# Patient Record
Sex: Female | Born: 1935
Health system: Southern US, Community
[De-identification: ages and names within clinical notes are randomized; demographics above are authoritative.]

## PROBLEM LIST (undated history)

## (undated) DIAGNOSIS — D499 Neoplasm of unspecified behavior of unspecified site: Secondary | ICD-10-CM

## (undated) DIAGNOSIS — A0472 Enterocolitis due to Clostridium difficile, not specified as recurrent: Secondary | ICD-10-CM

## (undated) DIAGNOSIS — I10 Essential (primary) hypertension: Secondary | ICD-10-CM

## (undated) DIAGNOSIS — E785 Hyperlipidemia, unspecified: Secondary | ICD-10-CM

## (undated) HISTORY — DX: Neoplasm of unspecified behavior of unspecified site: D49.9

## (undated) HISTORY — PX: SKIN SURGERY: SHX2413

## (undated) HISTORY — DX: Hyperlipidemia, unspecified: E78.5

## (undated) HISTORY — DX: Enterocolitis due to Clostridium difficile, not specified as recurrent: A04.72

## (undated) HISTORY — DX: Essential (primary) hypertension: I10

---

## 1980-08-29 HISTORY — PX: BREAST BIOPSY: SHX20

## 1982-08-29 HISTORY — PX: ABDOMINAL HYSTERECTOMY: SUR658

## 2014-09-19 DIAGNOSIS — L821 Other seborrheic keratosis: Secondary | ICD-10-CM | POA: Diagnosis not present

## 2014-09-19 DIAGNOSIS — Z85828 Personal history of other malignant neoplasm of skin: Secondary | ICD-10-CM | POA: Diagnosis not present

## 2015-01-14 DIAGNOSIS — L814 Other melanin hyperpigmentation: Secondary | ICD-10-CM | POA: Diagnosis not present

## 2015-01-14 DIAGNOSIS — D485 Neoplasm of uncertain behavior of skin: Secondary | ICD-10-CM | POA: Diagnosis not present

## 2015-01-14 DIAGNOSIS — Z85828 Personal history of other malignant neoplasm of skin: Secondary | ICD-10-CM | POA: Diagnosis not present

## 2015-01-25 DIAGNOSIS — I10 Essential (primary) hypertension: Secondary | ICD-10-CM | POA: Diagnosis not present

## 2015-01-25 DIAGNOSIS — E785 Hyperlipidemia, unspecified: Secondary | ICD-10-CM | POA: Diagnosis not present

## 2015-04-06 DIAGNOSIS — Z85828 Personal history of other malignant neoplasm of skin: Secondary | ICD-10-CM | POA: Diagnosis not present

## 2015-04-06 DIAGNOSIS — L570 Actinic keratosis: Secondary | ICD-10-CM | POA: Diagnosis not present

## 2015-04-06 DIAGNOSIS — D485 Neoplasm of uncertain behavior of skin: Secondary | ICD-10-CM | POA: Diagnosis not present

## 2015-07-26 DIAGNOSIS — Z760 Encounter for issue of repeat prescription: Secondary | ICD-10-CM | POA: Diagnosis not present

## 2015-07-26 DIAGNOSIS — E785 Hyperlipidemia, unspecified: Secondary | ICD-10-CM | POA: Diagnosis not present

## 2015-07-26 DIAGNOSIS — I10 Essential (primary) hypertension: Secondary | ICD-10-CM | POA: Diagnosis not present

## 2015-08-30 DIAGNOSIS — A0472 Enterocolitis due to Clostridium difficile, not specified as recurrent: Secondary | ICD-10-CM

## 2015-08-30 HISTORY — DX: Enterocolitis due to Clostridium difficile, not specified as recurrent: A04.72

## 2015-09-22 DIAGNOSIS — L570 Actinic keratosis: Secondary | ICD-10-CM | POA: Diagnosis not present

## 2015-09-22 DIAGNOSIS — L0109 Other impetigo: Secondary | ICD-10-CM | POA: Diagnosis not present

## 2015-09-22 DIAGNOSIS — L821 Other seborrheic keratosis: Secondary | ICD-10-CM | POA: Diagnosis not present

## 2015-09-22 DIAGNOSIS — L814 Other melanin hyperpigmentation: Secondary | ICD-10-CM | POA: Diagnosis not present

## 2015-09-22 DIAGNOSIS — Z85828 Personal history of other malignant neoplasm of skin: Secondary | ICD-10-CM | POA: Diagnosis not present

## 2015-09-22 DIAGNOSIS — L579 Skin changes due to chronic exposure to nonionizing radiation, unspecified: Secondary | ICD-10-CM | POA: Diagnosis not present

## 2015-10-09 DIAGNOSIS — Z85828 Personal history of other malignant neoplasm of skin: Secondary | ICD-10-CM | POA: Diagnosis not present

## 2015-10-09 DIAGNOSIS — L308 Other specified dermatitis: Secondary | ICD-10-CM | POA: Diagnosis not present

## 2015-11-04 DIAGNOSIS — L0109 Other impetigo: Secondary | ICD-10-CM | POA: Diagnosis not present

## 2015-11-04 DIAGNOSIS — L0100 Impetigo, unspecified: Secondary | ICD-10-CM | POA: Diagnosis not present

## 2015-12-16 DIAGNOSIS — L2089 Other atopic dermatitis: Secondary | ICD-10-CM | POA: Diagnosis not present

## 2016-01-17 DIAGNOSIS — E119 Type 2 diabetes mellitus without complications: Secondary | ICD-10-CM | POA: Diagnosis not present

## 2016-01-17 DIAGNOSIS — E7800 Pure hypercholesterolemia, unspecified: Secondary | ICD-10-CM | POA: Diagnosis not present

## 2016-01-17 DIAGNOSIS — I10 Essential (primary) hypertension: Secondary | ICD-10-CM | POA: Diagnosis not present

## 2016-01-18 LAB — HEMOGLOBIN A1C: Hemoglobin A1C: 5.5

## 2016-01-18 LAB — BASIC METABOLIC PANEL
BUN: 20 mg/dL (ref 4–21)
CREATININE: 1.1 mg/dL (ref 0.5–1.1)
GLUCOSE: 98 mg/dL
POTASSIUM: 4.3 mmol/L (ref 3.4–5.3)
SODIUM: 140 mmol/L (ref 137–147)

## 2016-01-18 LAB — LIPID PANEL
Cholesterol: 176 mg/dL (ref 0–200)
HDL: 77 mg/dL — AB (ref 35–70)
LDL Cholesterol: 76 mg/dL
Triglycerides: 117 mg/dL (ref 40–160)

## 2016-01-18 LAB — HEPATIC FUNCTION PANEL
ALT: 12 U/L (ref 7–35)
AST: 17 U/L (ref 13–35)
Alkaline Phosphatase: 85 U/L (ref 25–125)
BILIRUBIN, TOTAL: 0.5 mg/dL

## 2016-01-18 LAB — TSH: TSH: 0.86 u[IU]/mL (ref 0.41–5.90)

## 2016-01-23 DIAGNOSIS — E785 Hyperlipidemia, unspecified: Secondary | ICD-10-CM | POA: Diagnosis not present

## 2016-01-23 DIAGNOSIS — I10 Essential (primary) hypertension: Secondary | ICD-10-CM | POA: Diagnosis not present

## 2016-04-04 DIAGNOSIS — L821 Other seborrheic keratosis: Secondary | ICD-10-CM | POA: Diagnosis not present

## 2016-04-04 DIAGNOSIS — Z85828 Personal history of other malignant neoplasm of skin: Secondary | ICD-10-CM | POA: Diagnosis not present

## 2016-04-04 DIAGNOSIS — L570 Actinic keratosis: Secondary | ICD-10-CM | POA: Diagnosis not present

## 2016-05-02 DIAGNOSIS — R195 Other fecal abnormalities: Secondary | ICD-10-CM | POA: Diagnosis not present

## 2016-05-02 DIAGNOSIS — D72829 Elevated white blood cell count, unspecified: Secondary | ICD-10-CM | POA: Diagnosis not present

## 2016-05-02 DIAGNOSIS — R109 Unspecified abdominal pain: Secondary | ICD-10-CM | POA: Diagnosis not present

## 2016-05-03 DIAGNOSIS — R195 Other fecal abnormalities: Secondary | ICD-10-CM | POA: Diagnosis not present

## 2016-05-06 DIAGNOSIS — R109 Unspecified abdominal pain: Secondary | ICD-10-CM | POA: Diagnosis not present

## 2016-05-16 DIAGNOSIS — Z1211 Encounter for screening for malignant neoplasm of colon: Secondary | ICD-10-CM | POA: Diagnosis not present

## 2016-05-16 DIAGNOSIS — R109 Unspecified abdominal pain: Secondary | ICD-10-CM | POA: Diagnosis not present

## 2016-06-07 DIAGNOSIS — R634 Abnormal weight loss: Secondary | ICD-10-CM | POA: Diagnosis not present

## 2016-06-07 DIAGNOSIS — K625 Hemorrhage of anus and rectum: Secondary | ICD-10-CM | POA: Diagnosis not present

## 2016-06-07 DIAGNOSIS — R630 Anorexia: Secondary | ICD-10-CM | POA: Diagnosis not present

## 2016-06-07 DIAGNOSIS — R194 Change in bowel habit: Secondary | ICD-10-CM | POA: Diagnosis not present

## 2016-06-09 DIAGNOSIS — R197 Diarrhea, unspecified: Secondary | ICD-10-CM | POA: Diagnosis not present

## 2016-06-10 DIAGNOSIS — Z87891 Personal history of nicotine dependence: Secondary | ICD-10-CM | POA: Diagnosis not present

## 2016-06-10 DIAGNOSIS — R110 Nausea: Secondary | ICD-10-CM | POA: Diagnosis not present

## 2016-06-10 DIAGNOSIS — R197 Diarrhea, unspecified: Secondary | ICD-10-CM | POA: Diagnosis not present

## 2016-06-10 DIAGNOSIS — I493 Ventricular premature depolarization: Secondary | ICD-10-CM | POA: Diagnosis not present

## 2016-06-10 DIAGNOSIS — I10 Essential (primary) hypertension: Secondary | ICD-10-CM | POA: Diagnosis not present

## 2016-06-10 DIAGNOSIS — E785 Hyperlipidemia, unspecified: Secondary | ICD-10-CM | POA: Diagnosis not present

## 2016-06-10 DIAGNOSIS — R531 Weakness: Secondary | ICD-10-CM | POA: Diagnosis not present

## 2016-06-10 LAB — CBC AND DIFFERENTIAL
HCT: 34 % — AB (ref 36–46)
HEMOGLOBIN: 11.2 g/dL — AB (ref 12.0–16.0)
Platelets: 369 10*3/uL (ref 150–399)
WBC: 14.1 10*3/mL

## 2016-06-10 LAB — BASIC METABOLIC PANEL
BUN: 23 mg/dL — AB (ref 4–21)
Creatinine: 1.3 mg/dL — AB (ref 0.5–1.1)
POTASSIUM: 4.2 mmol/L (ref 3.4–5.3)
SODIUM: 139 mmol/L (ref 137–147)

## 2016-06-10 LAB — HEPATIC FUNCTION PANEL
ALK PHOS: 136 U/L — AB (ref 25–125)
ALT: 40 U/L — AB (ref 7–35)
AST: 34 U/L (ref 13–35)
BILIRUBIN, TOTAL: 0.5 mg/dL

## 2016-06-13 DIAGNOSIS — I10 Essential (primary) hypertension: Secondary | ICD-10-CM | POA: Diagnosis not present

## 2016-06-13 DIAGNOSIS — R0602 Shortness of breath: Secondary | ICD-10-CM | POA: Diagnosis not present

## 2016-06-13 DIAGNOSIS — Z87891 Personal history of nicotine dependence: Secondary | ICD-10-CM | POA: Diagnosis not present

## 2016-06-13 DIAGNOSIS — R002 Palpitations: Secondary | ICD-10-CM | POA: Diagnosis not present

## 2016-06-13 DIAGNOSIS — E871 Hypo-osmolality and hyponatremia: Secondary | ICD-10-CM | POA: Diagnosis not present

## 2016-06-13 DIAGNOSIS — R531 Weakness: Secondary | ICD-10-CM | POA: Diagnosis not present

## 2016-06-13 DIAGNOSIS — K529 Noninfective gastroenteritis and colitis, unspecified: Secondary | ICD-10-CM | POA: Diagnosis not present

## 2016-06-23 DIAGNOSIS — R002 Palpitations: Secondary | ICD-10-CM | POA: Diagnosis not present

## 2016-06-28 DIAGNOSIS — R197 Diarrhea, unspecified: Secondary | ICD-10-CM | POA: Diagnosis not present

## 2016-07-25 ENCOUNTER — Encounter: Payer: Self-pay | Admitting: Family Medicine

## 2016-07-25 ENCOUNTER — Ambulatory Visit (INDEPENDENT_AMBULATORY_CARE_PROVIDER_SITE_OTHER): Payer: Medicare Other | Admitting: Family Medicine

## 2016-07-25 VITALS — BP 110/50 | HR 97 | Temp 97.8°F | Ht 62.0 in | Wt 135.8 lb

## 2016-07-25 DIAGNOSIS — Z23 Encounter for immunization: Secondary | ICD-10-CM

## 2016-07-25 DIAGNOSIS — I10 Essential (primary) hypertension: Secondary | ICD-10-CM | POA: Diagnosis not present

## 2016-07-25 DIAGNOSIS — E785 Hyperlipidemia, unspecified: Secondary | ICD-10-CM | POA: Diagnosis not present

## 2016-07-25 DIAGNOSIS — Z8619 Personal history of other infectious and parasitic diseases: Secondary | ICD-10-CM

## 2016-07-25 LAB — BASIC METABOLIC PANEL
BUN: 43 mg/dL — AB (ref 6–23)
CALCIUM: 9.7 mg/dL (ref 8.4–10.5)
CO2: 23 mEq/L (ref 19–32)
Chloride: 105 mEq/L (ref 96–112)
Creatinine, Ser: 1.53 mg/dL — ABNORMAL HIGH (ref 0.40–1.20)
GFR: 34.84 mL/min — AB (ref >60.00)
Glucose, Bld: 97 mg/dL (ref 70–99)
POTASSIUM: 3.8 meq/L (ref 3.5–5.1)
SODIUM: 139 meq/L (ref 135–145)

## 2016-07-25 MED ORDER — PRAVASTATIN SODIUM 40 MG PO TABS: 40.0000 mg | ORAL_TABLET | Freq: Every day | ORAL | 2 refills | Status: DC

## 2016-07-25 MED ORDER — AMLODIPINE BESYLATE 5 MG PO TABS: 5.0000 mg | ORAL_TABLET | Freq: Every day | ORAL | 1 refills | Status: DC

## 2016-07-25 MED ORDER — LISINOPRIL 20 MG PO TABS: 20.0000 mg | ORAL_TABLET | Freq: Every day | ORAL | 3 refills | Status: DC

## 2016-07-25 NOTE — Patient Instructions (Addendum)
A few things to remember from today's visit:   History of Clostridium difficile colitis - Plan: Ambulatory referral to Gastroenterology  Essential hypertension - Plan: Basic Metabolic Panel, lisinopril (PRINIVIL,ZESTRIL) 20 MG tablet  HCTZ stopped because low blood pressure. Rest unchanged.   Monitor blood pressure, goal < 140/90.  Align 1 cap daily.     Please be sure medication list is accurate. If a new problem present, please set up appointment sooner than planned today.

## 2016-07-25 NOTE — Progress Notes (Signed)
Pre visit review using our clinic review tool, if applicable. No additional management support is needed unless otherwise documented below in the visit note. 
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
## 2016-07-29 ENCOUNTER — Encounter: Payer: Self-pay | Admitting: Physician Assistant

## 2016-08-09 ENCOUNTER — Encounter: Payer: Self-pay | Admitting: Physician Assistant

## 2016-08-09 ENCOUNTER — Ambulatory Visit (INDEPENDENT_AMBULATORY_CARE_PROVIDER_SITE_OTHER): Payer: Medicare Other | Admitting: Physician Assistant

## 2016-08-09 VITALS — BP 124/60 | HR 88 | Ht 62.0 in | Wt 138.4 lb

## 2016-08-09 DIAGNOSIS — A0472 Enterocolitis due to Clostridium difficile, not specified as recurrent: Secondary | ICD-10-CM | POA: Diagnosis not present

## 2016-08-09 NOTE — Progress Notes (Signed)
I agree with the above note, plan 

## 2016-08-09 NOTE — Progress Notes (Signed)
Subjective:    Patient ID: Amber Leach, female    DOB: 01-05-1936, 80 y.o.   MRN: [id number]  HPI Amber Leach is a grade nice 80 year old white female, new to GI today referred by Dr. Betty Martinique to establish GI care with history of recent C. difficile colitis. Patient states that she recently relocated from the [facility] area and was seen by [facility] earlier this fall. She states that her husband was hospitalized in May 2017 and was diagnosed with C. difficile during that admission. Patient says she started having problems in September with diarrhea which became severe. She says she had horrible diarrhea for about 3 weeks. She went to her PCP there had some stool studies done which apparently were negative and she was given a short course of Cipro did not help her symptoms. He was then started on a course of Flagyl and referred to gastroenterology. She had C. difficile repeated at the GI office which returned positive. Initially she was given another course of Flagyl with no improvement in symptoms. Patient says she also had side effects from the Flagyl with palpitations and a general feeling of ill health some dyspnea and absolutely no appetite. She was eventually started on vancomycin, completed a course and says that her symptoms resolved. Her last day of vancomycin was November 13. She says her appetite is returning. She had lost about 20 pounds and is now gaining some of that back. She has no current complaints of abdominal pain bowel movements are normal and there has been no melena or hematochezia. Patient has not had any prior colonoscopy, she has no family history of colon cancer. She is not interested in having a colonoscopy. She states she has her hands full taking care of her husband who has dementia. Review of Systems Pertinent positive and negative review of systems were noted in the above HPI section.  All other review of systems was otherwise  negative.  Outpatient Encounter Prescriptions as of 08/09/2016  Medication Sig  . amLODipine (NORVASC) 5 MG tablet Take 1 tablet (5 mg total) by mouth daily.  Marland Kitchen aspirin EC 81 MG tablet Take 81 mg by mouth daily.  Marland Kitchen lisinopril (PRINIVIL,ZESTRIL) 20 MG tablet Take 1 tablet (20 mg total) by mouth daily.  . pravastatin (PRAVACHOL) 40 MG tablet Take 1 tablet (40 mg total) by mouth daily.   No facility-administered encounter medications on file as of 08/09/2016.    Allergies  Allergen Reactions  . Sulfa Antibiotics   . Vicodin [Hydrocodone-Acetaminophen] Nausea And Vomiting   Patient Active Problem List   Diagnosis Date Noted  . Essential hypertension 07/25/2016  . Hyperlipidemia 07/25/2016   Social History   Social History  . Marital status: Married    Spouse name: 2  . Number of children: N/A  . Years of education: N/A   Occupational History  . Retired    Social History Main Topics  . Smoking status: Former Smoker    Types: Cigarettes    Quit date: 2008  . Smokeless tobacco: Never Used  . Alcohol use No  . Drug use: No  . Sexual activity: Not Currently   Other Topics Concern  . Not on file   Social History Narrative  . No narrative on file    Ms. Hudler's family history includes Alcohol abuse in her father, mother, paternal grandfather, paternal grandmother, and sister; Arthritis in her mother; Heart disease in her mother.      Objective:    Vitals:  08/09/16 1022  BP: 124/60  Pulse: 88    Physical Exam  well-developed older white female in no acute distress, blood pressure 124/60 pulse 88, BMI 25.3. HEENT; nontraumatic normocephalic EOMI PERRLA sclera anicteric, Cardiovascular; regular rate and rhythm with S1-S2 no murmur or gallop, Pulmonary ;clear bilaterally, Abdomen ;soft, nontender nondistended bowel sounds are active there is no palpable mass or hepatosplenomegaly, Rectal ;exam not done, Ext; no clubbing cyanosis or edema skin warm and dry,  Neuropsych ;mood and affect appropriate       Assessment & Plan:   #55 80 year old white female with recent C. difficile colitis, refractory to metronidazole and responsive to a course of vancomycin which she completed 1 month ago. She remains asymptomatic at this time She had been exposed to C. difficile via her husband who was hospitalized and diagnosed with C. difficile in May 2017. #2 colon cancer screening-no prior colonoscopy. #3 hypertension  Plan; We discussed the natural course of C. difficile colitis and possibility of recurrence. Discussed colon cancer screening with colonoscopy versus Cologuard stool DNA testing. Patient does not want to pursue either of these at this time. She is advised to call should she have any recurrence in diarrhea over the next several months, otherwise will follow up as needed. Patient will be established with Dr. Ardis Hughs.   Rosaire Cueto Genia Harold PA-C 08/09/2016   Cc: Martinique, Betty G, MD

## 2016-08-09 NOTE — Patient Instructions (Signed)
Follow up as needed. Call us for problems.

## 2016-09-01 ENCOUNTER — Encounter: Payer: Self-pay | Admitting: Physician Assistant

## 2016-09-27 ENCOUNTER — Ambulatory Visit (INDEPENDENT_AMBULATORY_CARE_PROVIDER_SITE_OTHER): Payer: Medicare Other | Admitting: Family Medicine

## 2016-09-27 ENCOUNTER — Encounter: Payer: Self-pay | Admitting: Family Medicine

## 2016-09-27 VITALS — BP 120/62 | HR 93 | Resp 12 | Ht 62.0 in | Wt 140.0 lb

## 2016-09-27 DIAGNOSIS — R944 Abnormal results of kidney function studies: Secondary | ICD-10-CM

## 2016-09-27 DIAGNOSIS — K1370 Unspecified lesions of oral mucosa: Secondary | ICD-10-CM | POA: Diagnosis not present

## 2016-09-27 DIAGNOSIS — I10 Essential (primary) hypertension: Secondary | ICD-10-CM | POA: Diagnosis not present

## 2016-09-27 DIAGNOSIS — E7849 Other hyperlipidemia: Secondary | ICD-10-CM

## 2016-09-27 DIAGNOSIS — E784 Other hyperlipidemia: Secondary | ICD-10-CM | POA: Diagnosis not present

## 2016-09-27 LAB — BASIC METABOLIC PANEL
BUN: 21 mg/dL (ref 6–23)
CHLORIDE: 106 meq/L (ref 96–112)
CO2: 28 meq/L (ref 19–32)
CREATININE: 1.19 mg/dL (ref 0.40–1.20)
Calcium: 10.1 mg/dL (ref 8.4–10.5)
GFR: 46.54 mL/min — ABNORMAL LOW (ref >60.00)
Glucose, Bld: 89 mg/dL (ref 70–99)
POTASSIUM: 4.4 meq/L (ref 3.5–5.1)
Sodium: 140 mEq/L (ref 135–145)

## 2016-09-27 LAB — MICROALBUMIN / CREATININE URINE RATIO
Creatinine,U: 94.6 mg/dL
MICROALB UR: 0.8 mg/dL (ref 0.0–1.9)
Microalb Creat Ratio: 0.8 mg/g (ref 0.0–30.0)

## 2016-09-27 LAB — LIPID PANEL
CHOL/HDL RATIO: 3
CHOLESTEROL: 178 mg/dL (ref 0–200)
HDL: 66.2 mg/dL (ref >39.00)
LDL CALC: 89 mg/dL (ref 0–99)
NonHDL: 111.56
TRIGLYCERIDES: 113 mg/dL (ref 0.0–149.0)
VLDL: 22.6 mg/dL (ref 0.0–40.0)

## 2016-09-27 MED ORDER — LISINOPRIL 20 MG PO TABS: 20.0000 mg | ORAL_TABLET | Freq: Every day | ORAL | 1 refills | Status: DC

## 2016-09-27 NOTE — Progress Notes (Signed)
Amber Leach is a 81 y.o.female, who is here today with her daughter to follow on HTN and HLD.  Since her last office visit, 07/25/2016, she has seen GI, refused colonoscopy. S/P Clostridium difficile infection, GI symptoms completely resolved.   Currently she is on lisinopril 20 mg. Last office visit hydrochlorothiazide was discontinued. She is not longer having dizziness or lightheadedness.  She is taking medications as instructed, no side effects reported.  She has not noted unusual headache, visual changes, exertional chest pain, dyspnea,  focal weakness, or edema.  She is checking BP at home: 137/56, 145/55, 127/58, 140/57, 133/56, 127/55, 125/52, and 142/53.    Lab Results  Component Value Date   CREATININE 1.53 (H) 07/25/2016   BUN 43 (H) 07/25/2016   NA 139 07/25/2016   K 3.8 07/25/2016   CL 105 07/25/2016   CO2 23 07/25/2016    Hyperlipidemia:  Currently on Pravastatin 40 mg daily. Following a low fat diet: yes.  She has not noted side effects with medication.  Lab Results  Component Value Date   CHOL 176 01/18/2016   HDL 77 (A) 01/18/2016   LDLCALC 76 01/18/2016   TRIG 117 01/18/2016    She is not exercising regularly, she denies any fall since her last office visit.   -On examination today to hyperpigmented lesions were appreciated on oral mucosa, left cheek. She denies any tenderness, odynophagia, dysphagia, or history of trauma. No tobacco use, former smoker.   Review of Systems  Constitutional: Negative for appetite change, fatigue and fever.  HENT: Negative for mouth sores, nosebleeds and trouble swallowing.   Eyes: Negative for pain and visual disturbance.  Respiratory: Negative for cough, shortness of breath and wheezing.   Cardiovascular: Negative for chest pain, palpitations and leg swelling.  Gastrointestinal: Negative for abdominal pain, nausea and vomiting.       Negative for changes in bowel habits.  Genitourinary:  Negative for decreased urine volume and hematuria.  Musculoskeletal: Negative for gait problem and myalgias.  Skin: Negative for rash.  Neurological: Negative for seizures, syncope, weakness, numbness and headaches.  Psychiatric/Behavioral: Negative for confusion. The patient is not nervous/anxious.      Current Outpatient Prescriptions on File Prior to Visit  Medication Sig Dispense Refill  . amLODipine (NORVASC) 5 MG tablet Take 1 tablet (5 mg total) by mouth daily. 90 tablet 1  . aspirin EC 81 MG tablet Take 81 mg by mouth daily.    Marland Kitchen lisinopril (PRINIVIL,ZESTRIL) 20 MG tablet Take 1 tablet (20 mg total) by mouth daily. 30 tablet 3  . pravastatin (PRAVACHOL) 40 MG tablet Take 1 tablet (40 mg total) by mouth daily. 90 tablet 2   No current facility-administered medications on file prior to visit.      Past Medical History:  Diagnosis Date  . Hyperlipidemia   . Hypertension     Allergies  Allergen Reactions  . Sulfa Antibiotics   . Vicodin [Hydrocodone-Acetaminophen] Nausea And Vomiting    Social History   Social History  . Marital status: Married    Spouse name: 2  . Number of children: N/A  . Years of education: N/A   Occupational History  . Retired    Social History Main Topics  . Smoking status: Former Smoker    Types: Cigarettes    Quit date: 2008  . Smokeless tobacco: Never Used  . Alcohol use No  . Drug use: No  . Sexual activity: Not Currently   Other Topics  Concern  . None   Social History Narrative  . None    Vitals:   09/27/16 0845  BP: 120/62  Pulse: 93  Resp: 12   Body mass index is 25.61 kg/m.    Physical Exam  Nursing note and vitals reviewed. Constitutional: She is oriented to person, place, and time. She appears well-developed and well-nourished. No distress.  HENT:  Head: Atraumatic.  Mouth/Throat: Oropharynx is clear and moist and mucous membranes are normal.  Oral mucosa on left cheek 2 small (1 mm) hyperpigmented lesions  surrounded by mild erythema, no tender, regular borders.  Eyes: Conjunctivae and EOM are normal. Pupils are equal, round, and reactive to light.  Neck: No tracheal deviation present. No thyroid mass and no thyromegaly present.  Cardiovascular: Normal rate and regular rhythm.   No murmur heard. Pulses:      Dorsalis pedis pulses are 2+ on the right side, and 2+ on the left side.  Respiratory: Effort normal and breath sounds normal. No respiratory distress.  GI: Soft. She exhibits no mass. There is no hepatomegaly. There is no tenderness.  Musculoskeletal: She exhibits no edema or tenderness.  Lymphadenopathy:       Head (right side): No submandibular adenopathy present.       Head (left side): No submandibular adenopathy present.    She has no cervical adenopathy.  Neurological: She is alert and oriented to person, place, and time. She has normal strength. Coordination and gait normal.  Skin: Skin is warm. No erythema.  Psychiatric: She has a normal mood and affect.  Well groomed, good eye contact.    ASSESSMENT AND PLAN:   Jamei was seen today for follow-up.  Diagnoses and all orders for this visit:   Essential hypertension  Adequately controlled. No changes in current management. DASH diet recommended. Periodic eye exam recommended. F/U in 6 months, before if needed.  -     Basic metabolic panel -     Microalbumin / creatinine urine ratio  Other hyperlipidemia  No changes in current management, will follow labs done today and will give further recommendations accordingly. Low fat diet and regular physical activity (Silver Sneakers class) also recommended. F/U in 6-12 months.  -     Lipid panel  Oral mucosal lesion  Physicians do not seem suspicious. ? Trauma. She will monitor for changes. Recommend following with her dentist as well.  Abnormal renal function test  Continue lisinopril. Good BP control. Adequate hydration. Further recommendations would be  given according to lab results.  -     Basic metabolic panel -     Microalbumin / creatinine urine ratio     -Ms. Faatimah Yoss Hedman was advised to return sooner than planned today if new concerns arise.     Vola Beneke G. Martinique, MD  [facility]. [place] office.

## 2016-09-27 NOTE — Patient Instructions (Signed)
A few things to remember from today's visit:   Essential hypertension - Plan: Basic metabolic panel, Microalbumin / creatinine urine ratio  Blood pressure goal for most people is less than 140/90.Some populations (older than 60) the goal is less than 150/90.  Elevated blood pressure increases the risk of strokes, heart and kidney disease, and eye problems. Regular physical activity and a healthy diet (DASH diet) usually help. Low salt diet. Take medications as instructed. Caution with some over the counter medications as cold medications, dietary products (for weight loss), and Ibuprofen or Aleve (frequent use);all these medications could cause elevation of blood pressure.   Please be sure medication list is accurate. If a new problem present, please set up appointment sooner than planned today.

## 2016-09-27 NOTE — Progress Notes (Signed)
Pre visit review using our clinic review tool, if applicable. No additional management support is needed unless otherwise documented below in the visit note. 

## 2017-01-20 ENCOUNTER — Other Ambulatory Visit: Payer: Self-pay | Admitting: Family Medicine

## 2017-01-20 DIAGNOSIS — I10 Essential (primary) hypertension: Secondary | ICD-10-CM

## 2017-02-26 DIAGNOSIS — N183 Chronic kidney disease, stage 3 unspecified: Secondary | ICD-10-CM | POA: Insufficient documentation

## 2017-02-26 NOTE — Progress Notes (Signed)
HPI:   Amber Leach is a 81 y.o. female, who is here today with her daughter to follow on some chronic medical problems.  She has not had any major health problem since her last OV.  Hypertension:    Currently on Lisinopril 20 mg daily and Amlodipine 5 mg daily. BP readings: 130's/60's, checks BP once per week. Last eye exam: several years ago.    She is taking medications as instructed, no side effects reported.  She has not noted unusual headache, visual changes, exertional chest pain, dyspnea,  focal weakness, or edema.   Lab Results  Component Value Date   CREATININE 1.19 09/27/2016   BUN 21 09/27/2016   NA 140 09/27/2016   K 4.4 09/27/2016   CL 106 09/27/2016   CO2 28 09/27/2016    CKD III, she denies gross hematuria,foam in urine,or decreased urine output.  Lab Results  Component Value Date   MICROALBUR 0.8 09/27/2016    Hyperlipidemia:  Currently on Pravastatin 40 mg daily.  Following a low fat diet: For the past 2 months she has ben eating ice cream daily. She ahs noted some wt gain. Denies Hx of CVD. She follows a low fat diet.  She has not noted side effects with medication.  Lab Results  Component Value Date   CHOL 178 09/27/2016   HDL 66.20 09/27/2016   LDLCALC 89 09/27/2016   TRIG 113.0 09/27/2016   CHOLHDL 3 09/27/2016    Elevated alkaline phosphatase and AST in the past.  Lab Results  Component Value Date   ALT 40 (A) 06/10/2016   AST 34 06/10/2016   ALKPHOS 136 (A) 06/10/2016    Denies abdominal pain, nausea, vomiting, or changes in bowel habits.   Review of Systems  Constitutional: Negative for activity change, appetite change, fatigue and fever.  HENT: Negative for mouth sores, nosebleeds and trouble swallowing.   Eyes: Negative for redness and visual disturbance.  Respiratory: Negative for cough, shortness of breath and wheezing.   Cardiovascular: Negative for chest pain, palpitations and leg  swelling.  Gastrointestinal: Negative for abdominal pain, nausea and vomiting.       Negative for changes in bowel habits.  Genitourinary: Negative for decreased urine volume and hematuria.  Musculoskeletal: Negative for gait problem and myalgias.  Skin: Negative for pallor and rash.  Neurological: Negative for syncope, weakness and headaches.  Psychiatric/Behavioral: Negative for confusion. The patient is not nervous/anxious.       Current Outpatient Prescriptions on File Prior to Visit  Medication Sig Dispense Refill  . amLODipine (NORVASC) 5 MG tablet TAKE ONE TABLET BY MOUTH DAILY 90 tablet 1  . aspirin EC 81 MG tablet Take 81 mg by mouth daily.    . pravastatin (PRAVACHOL) 40 MG tablet Take 1 tablet (40 mg total) by mouth daily. 90 tablet 2   No current facility-administered medications on file prior to visit.      Past Medical History:  Diagnosis Date  . Hyperlipidemia   . Hypertension    Allergies  Allergen Reactions  . Sulfa Antibiotics   . Vicodin [Hydrocodone-Acetaminophen] Nausea And Vomiting    Social History   Social History  . Marital status: Married    Spouse name: 2  . Number of children: N/A  . Years of education: N/A   Occupational History  . Retired    Social History Main Topics  . Smoking status: Former Smoker    Types: Cigarettes    Quit date:  2008  . Smokeless tobacco: Never Used  . Alcohol use No  . Drug use: No  . Sexual activity: Not Currently   Other Topics Concern  . None   Social History Narrative  . None    Vitals:   02/27/17 0857 02/27/17 0933  BP: 140/68 125/65  Pulse: 87   Resp: 12    Body mass index is 26.89 kg/m.  Wt Readings from Last 3 Encounters:  02/27/17 147 lb (66.7 kg)  09/27/16 140 lb (63.5 kg)  08/09/16 138 lb 6.4 oz (62.8 kg)     Physical Exam  Nursing note and vitals reviewed. Constitutional: She is oriented to person, place, and time. She appears well-developed and well-nourished. No distress.    HENT:  Head: Atraumatic.  Mouth/Throat: Oropharynx is clear and moist and mucous membranes are normal.  Eyes: Conjunctivae and EOM are normal. Pupils are equal, round, and reactive to light.  Cardiovascular: Normal rate and regular rhythm.   No murmur heard. Pulses:      Dorsalis pedis pulses are 2+ on the right side, and 2+ on the left side.  Varicose veins LE bilateral.  Respiratory: Effort normal and breath sounds normal. No respiratory distress.  GI: Soft. She exhibits no mass. There is no hepatomegaly. There is no tenderness.  Musculoskeletal: She exhibits no edema or tenderness.  Lymphadenopathy:    She has no cervical adenopathy.  Neurological: She is alert and oriented to person, place, and time. She has normal strength. Gait normal.  Skin: Skin is warm. No rash noted. No erythema.  Psychiatric: She has a normal mood and affect.  Well groomed, good eye contact.    ASSESSMENT AND PLAN:   [name] was seen today for follow-up.  Diagnoses and all orders for this visit:  Lab Results  Component Value Date   CHOL 162 02/27/2017   HDL 68.20 02/27/2017   LDLCALC 73 02/27/2017   TRIG 106.0 02/27/2017   CHOLHDL 2 02/27/2017     Lab Results  Component Value Date   CREATININE 1.20 02/27/2017   BUN 20 02/27/2017   NA 141 02/27/2017   K 4.4 02/27/2017   CL 105 02/27/2017   CO2 28 02/27/2017   Lab Results  Component Value Date   WBC 9.9 02/27/2017   HGB 12.2 02/27/2017   HCT 36.9 02/27/2017   MCV 91.4 02/27/2017   PLT 282.0 02/27/2017   Lab Results  Component Value Date   ALT 12 02/27/2017   AST 22 02/27/2017   ALKPHOS 75 02/27/2017   BILITOT 0.5 02/27/2017    Abnormal liver function test  Mild and asymptomatic. No changes in Pravastatin for now.  Further recommendations will be given according to labs results.  -     Comprehensive metabolic panel  CKD (chronic kidney disease), stage III  Continue Lisinopril. Low salt diet and avoidance of oral  NSAID's recommended. Adequate BP controlled.  -     VITAMIN D 25 Hydroxy (Vit-D Deficiency, Fractures) -     CBC  Other hyperlipidemia  No changes in current management, will follow labs done today and will give further recommendations accordingly. May consider decreasing dose of Pravastatin. F/U in 6-12 months.  -     Lipid panel  Essential hypertension  Adequately controlled. No changes in current management. DASH-low salt diet to continue. Eye exam recommended annually. F/U in 6 months, before if needed.  -     lisinopril (PRINIVIL,ZESTRIL) 20 MG tablet; Take 1 tablet (20 mg total) by  mouth daily.     -Ms. Gelene Recktenwald Paynter was advised to return sooner than planned today if new concerns arise.       Pope Brunty G. Martin[place], MD  [facility]. [place] office.

## 2017-02-27 ENCOUNTER — Encounter: Payer: Self-pay | Admitting: Family Medicine

## 2017-02-27 ENCOUNTER — Ambulatory Visit (INDEPENDENT_AMBULATORY_CARE_PROVIDER_SITE_OTHER): Payer: Medicare Other | Admitting: Family Medicine

## 2017-02-27 VITALS — BP 125/65 | HR 87 | Resp 12 | Ht 62.0 in | Wt 147.0 lb

## 2017-02-27 DIAGNOSIS — R945 Abnormal results of liver function studies: Secondary | ICD-10-CM

## 2017-02-27 DIAGNOSIS — I10 Essential (primary) hypertension: Secondary | ICD-10-CM

## 2017-02-27 DIAGNOSIS — E784 Other hyperlipidemia: Secondary | ICD-10-CM | POA: Diagnosis not present

## 2017-02-27 DIAGNOSIS — N183 Chronic kidney disease, stage 3 unspecified: Secondary | ICD-10-CM

## 2017-02-27 DIAGNOSIS — R7989 Other specified abnormal findings of blood chemistry: Secondary | ICD-10-CM

## 2017-02-27 DIAGNOSIS — E7849 Other hyperlipidemia: Secondary | ICD-10-CM

## 2017-02-27 LAB — CBC
HCT: 36.9 % (ref 36.0–46.0)
HEMOGLOBIN: 12.2 g/dL (ref 12.0–15.0)
MCHC: 33.2 g/dL (ref 30.0–36.0)
MCV: 91.4 fl (ref 78.0–100.0)
PLATELETS: 282 10*3/uL (ref 150.0–400.0)
RBC: 4.03 Mil/uL (ref 3.87–5.11)
RDW: 14.9 % (ref 11.5–15.5)
WBC: 9.9 10*3/uL (ref 4.0–10.5)

## 2017-02-27 LAB — LIPID PANEL
CHOL/HDL RATIO: 2
CHOLESTEROL: 162 mg/dL (ref 0–200)
HDL: 68.2 mg/dL (ref >39.00)
LDL CALC: 73 mg/dL (ref 0–99)
NonHDL: 94.19
Triglycerides: 106 mg/dL (ref 0.0–149.0)
VLDL: 21.2 mg/dL (ref 0.0–40.0)

## 2017-02-27 LAB — VITAMIN D 25 HYDROXY (VIT D DEFICIENCY, FRACTURES): VITD: 34.13 ng/mL (ref 30.00–100.00)

## 2017-02-27 LAB — COMPREHENSIVE METABOLIC PANEL
ALT: 12 U/L (ref 0–35)
AST: 22 U/L (ref 0–37)
Albumin: 4.2 g/dL (ref 3.5–5.2)
Alkaline Phosphatase: 75 U/L (ref 39–117)
BUN: 20 mg/dL (ref 6–23)
CALCIUM: 10.2 mg/dL (ref 8.4–10.5)
CHLORIDE: 105 meq/L (ref 96–112)
CO2: 28 meq/L (ref 19–32)
Creatinine, Ser: 1.2 mg/dL (ref 0.40–1.20)
GFR: 46.04 mL/min — AB (ref >60.00)
Glucose, Bld: 94 mg/dL (ref 70–99)
POTASSIUM: 4.4 meq/L (ref 3.5–5.1)
Sodium: 141 mEq/L (ref 135–145)
Total Bilirubin: 0.5 mg/dL (ref 0.2–1.2)
Total Protein: 7.2 g/dL (ref 6.0–8.3)

## 2017-02-27 MED ORDER — LISINOPRIL 20 MG PO TABS: 20.0000 mg | ORAL_TABLET | Freq: Every day | ORAL | 2 refills | Status: DC

## 2017-02-27 NOTE — Patient Instructions (Signed)
A few things to remember from today's visit:   Abnormal liver function test - Plan: Comprehensive metabolic panel  CKD (chronic kidney disease), stage III - Plan: VITAMIN D 25 Hydroxy (Vit-D Deficiency, Fractures), CBC    To preserve kidney function and/or slow progression of disease:  Low salt diet and adequate hydration. Avoiding medicines known as "nonsteroidal anti-inflammatory drugs," or NSAIDs. These medicines include ibuprofen (sample brand names: Advil, Motrin) and naproxen (sample brand name: Aleve).  Adequate blood pressure control. Low phosphorus diet.   Please be sure medication list is accurate. If a new problem present, please set up appointment sooner than planned today.

## 2017-04-05 ENCOUNTER — Ambulatory Visit (INDEPENDENT_AMBULATORY_CARE_PROVIDER_SITE_OTHER): Payer: Medicare Other | Admitting: Family Medicine

## 2017-04-05 VITALS — BP 160/80 | HR 100 | Temp 98.3°F | Wt 149.2 lb

## 2017-04-05 DIAGNOSIS — L301 Dyshidrosis [pompholyx]: Secondary | ICD-10-CM

## 2017-04-05 NOTE — Progress Notes (Signed)
Subjective:     Patient ID: Amber Leach, female   DOB: 1936-06-14, 81 y.o.   MRN: [id number]  HPI Patient seen for acute visit with left hand rash. Onset last Friday. She states she had similar rash in the past which was evaluated by dermatologist. She's had previous cultures which have been negative for fungus. Presumed dyshidrosis. Rash starts with small blisters which eventually pop and then crust over. No pustules. She tried several things including over-the-counter calamine, Neosporin, hydrocortisone cream, hydrogen peroxide, and over-the-counter athlete's foot cream without improvement. Denies any other areas of involvement.  Past Medical History:  Diagnosis Date  . Hyperlipidemia   . Hypertension    Past Surgical History:  Procedure Laterality Date  . ABDOMINAL HYSTERECTOMY  1984  . BREAST BIOPSY  1982    reports that she quit smoking about 10 years ago. Her smoking use included Cigarettes. She has never used smokeless tobacco. She reports that she does not drink alcohol or use drugs. family history includes Alcohol abuse in her father, mother, paternal grandfather, paternal grandmother, and sister; Arthritis in her mother; Heart disease in her mother. Allergies  Allergen Reactions  . Sulfa Antibiotics   . Vicodin [Hydrocodone-Acetaminophen] Nausea And Vomiting     Review of Systems  Constitutional: Negative for chills and fever.  Skin: Positive for rash.       Objective:   Physical Exam  Constitutional: She appears well-developed and well-nourished.  Skin: Rash noted.  Patient has rash involving left hand including the distal palm and several digits. This includes mostly the volar surface and lateral aspects of several digits. She has very small vesicles and some are slightly crusted. No pustules. Nontender.       Assessment:     Recurrent skin rash left hand. Question dyshidrotic eczema    Plan:     -Leave off all over-the-counter topicals as  above -Topicort 0.25% cream twice daily no more than 2 weeks continuously -Keep hands out of water as much as possible -Touch base if not improving in 2 weeks  Eulas Post MD [facility]

## 2017-04-18 NOTE — Progress Notes (Signed)
HPI:   Amber Leach is a 81 y.o. female, who is here today with her daughter to follow on recent acute visit.Marland Kitchen   She was seen on 04/05/17 because pruritic hand rash presumed to be dyshidrotic eczema. She has seen dermatologists in the past for similar rash. Rash usually affects left hand. Rx for Topicort 0.25% was given. She noted some improvement after a week of using cream but started getting worse again.  She has not identified exacerbating factors.  According to patient, she has had this rash intermittently for years, never resolved but under control.  Left-handed. + Stress. Denies new hand lotion, soap, or other skin product. According to daughter she washes her hands "a lot."  Denies associated fever,chills, oral lesions, or body aches. Arthralgias, she has Hx of OA and stable.  Review of Systems  Constitutional: Negative for appetite change, chills, fatigue and fever.  HENT: Negative for congestion, mouth sores, nosebleeds, sneezing, sore throat, trouble swallowing and voice change.   Eyes: Negative for discharge and redness.  Respiratory: Negative for cough, shortness of breath and wheezing.   Cardiovascular: Negative for chest pain, palpitations and leg swelling.  Gastrointestinal: Negative for abdominal pain, diarrhea, nausea and vomiting.  Musculoskeletal: Negative for arthralgias, back pain, gait problem, joint swelling and myalgias.  Skin: Positive for rash. Negative for wound.  Allergic/Immunologic: Negative for environmental allergies.  Neurological: Negative for weakness, numbness and headaches.  Hematological: Negative for adenopathy. Does not bruise/bleed easily.  Psychiatric/Behavioral: Negative for confusion. The patient is nervous/anxious.       Current Outpatient Prescriptions on File Prior to Visit  Medication Sig Dispense Refill  . amLODipine (NORVASC) 5 MG tablet TAKE ONE TABLET BY MOUTH DAILY 90 tablet 1  . aspirin EC 81 MG  tablet Take 81 mg by mouth daily.    Marland Kitchen lisinopril (PRINIVIL,ZESTRIL) 20 MG tablet Take 1 tablet (20 mg total) by mouth daily. 90 tablet 2  . pravastatin (PRAVACHOL) 40 MG tablet Take 1 tablet (40 mg total) by mouth daily. 90 tablet 2   No current facility-administered medications on file prior to visit.      Past Medical History:  Diagnosis Date  . Hyperlipidemia   . Hypertension    Allergies  Allergen Reactions  . Sulfa Antibiotics   . Vicodin [Hydrocodone-Acetaminophen] Nausea And Vomiting    Social History   Social History  . Marital status: Married    Spouse name: 2  . Number of children: N/A  . Years of education: N/A   Occupational History  . Retired    Social History Main Topics  . Smoking status: Former Smoker    Types: Cigarettes    Quit date: 2008  . Smokeless tobacco: Never Used  . Alcohol use No  . Drug use: No  . Sexual activity: Not Currently   Other Topics Concern  . None   Social History Narrative  . None    Vitals:   04/19/17 1423  BP: 120/68  Pulse: 90  Resp: 12  SpO2: 97%   Body mass index is 27.34 kg/m.   Physical Exam  Nursing note and vitals reviewed. Constitutional: She is oriented to person, place, and time. She appears well-developed. No distress.  HENT:  Head: Normocephalic and atraumatic.  Mouth/Throat: Oropharynx is clear and moist and mucous membranes are normal.  Eyes: Conjunctivae are normal.  Cardiovascular:  Pulses:      Radial pulses are 2+ on the left side.  Respiratory: Effort normal  and breath sounds normal. No respiratory distress.  Musculoskeletal: She exhibits no edema.  Mild IP joint deformities, Bouchard's nodule 5th finger.  Lymphadenopathy:    She has no cervical adenopathy.  Neurological: She is alert and oriented to person, place, and time. She has normal strength.  Skin: Skin is warm. Rash noted. Rash is papular.  Dry and scaly palm left hand with small scattered skin fissures. A couple of scaly  areas on dorsum.No edema or indurated areas. Mild micropapular lesions on fingers, no vesicular lesions appreciated. Fingernails not involved.  Psychiatric: Her speech is normal. Her mood appears anxious.  Well groomed, good eye contact.      ASSESSMENT AND PLAN:   [name] was seen today for rash.  Diagnoses and all orders for this visit:  Dyshidrotic eczema  Educated about Dx and prognosis as well as side effects of topical steroids. She will try Clobetasol cream, small amount and max 14 days at the time. Decrease frequency of hand wash. She can were cotton gloves at bedtime and prn under plastic glove when she needs to wash dishes.  Fragrance free moisturizer and soaps. She is concern about germs when she goes shopping, which is when she washes hands more frequent.She could use hand sanitizer with no alcohol and fragrance free.  Dermatology referral placed.    -     clobetasol cream (TEMOVATE) 0.05 %; Apply 1 application topically 2 (two) times daily. 14 days -     Ambulatory referral to Dermatology       Lamark Schue G. Martinique, MD  [facility]. [place] office.

## 2017-04-19 ENCOUNTER — Ambulatory Visit (INDEPENDENT_AMBULATORY_CARE_PROVIDER_SITE_OTHER): Payer: Medicare Other | Admitting: Family Medicine

## 2017-04-19 ENCOUNTER — Encounter: Payer: Self-pay | Admitting: Family Medicine

## 2017-04-19 DIAGNOSIS — L301 Dyshidrosis [pompholyx]: Secondary | ICD-10-CM | POA: Diagnosis not present

## 2017-04-19 MED ORDER — CLOBETASOL PROPIONATE 0.05 % EX CREA: 1.0000 "application " | TOPICAL_CREAM | Freq: Two times a day (BID) | CUTANEOUS | 1 refills | Status: DC

## 2017-04-19 NOTE — Patient Instructions (Signed)
A few things to remember from today's visit:   Dyshidrotic eczema - Plan: clobetasol cream (TEMOVATE) 0.05 %, Ambulatory referral to Dermatology   Hand Dermatitis Hand dermatitis is a skin condition that causes small, itchy, raised dots or fluid-filled blisters to form over the palms of the hands. This condition may also be called hand eczema. What are the causes? The cause of this condition is not known. What increases the risk? This condition is more likely to develop in people who have a history of allergies, such as:  Hay fever.  Allergic asthma.  An allergy to latex.  Chemical exposure, injuries, and environmental irritants can make hand dermatitis worse. Washing your hands too often can remove natural oils, which can dry out the skin and contribute to outbreaks of this condition. What are the signs or symptoms? The most common symptom of this condition is intense itchiness. Cracks or grooves (fissures) on the fingers can also develop. Affected areas can be painful, especially areas where large blisters have formed. How is this diagnosed? This condition is diagnosed with a medical history and physical exam. How is this treated? This condition is treated with medicines, including:  Steroid creams and ointments.  Oral steroid medicines.  Antibiotic medicines. These are prescribed if you have an infection.  Antihistamine medicines. These help to reduce itchiness.  Follow these instructions at home:  Take or apply over-the-counter and prescription medicines only as told by your health care provider.  If you were prescribed an antibiotic medicine, use it as told by your health care provider. Do not stop using the antibiotic even if you start to feel better.  Avoid washing your hands more often than necessary.  Avoid using harsh chemicals on your hands.  Wear protective gloves when you handle products that can irritate your skin.  Keep all follow-up visits as told by  your health care provider. This is important. Contact a health care provider if:  Your rash does not improve during the first week of treatment.  Your rash is red or tender.  Your rash has pus coming from it.  Your rash spreads. This information is not intended to replace advice given to you by your health care provider. Make sure you discuss any questions you have with your health care provider. Document Released: 08/15/2005 Document Revised: 01/21/2016 Document Reviewed: 02/27/2015 Elsevier Interactive Patient Education  2018 [employer].  Please be sure medication list is accurate. If a new problem present, please set up appointment sooner than planned today.

## 2017-04-21 ENCOUNTER — Telehealth: Payer: Self-pay

## 2017-04-21 NOTE — Telephone Encounter (Signed)
Received PA request for Clobetasol. PA submitted & pending. Key: [id number]

## 2017-04-24 ENCOUNTER — Other Ambulatory Visit: Payer: Self-pay | Admitting: Family Medicine

## 2017-04-24 DIAGNOSIS — E785 Hyperlipidemia, unspecified: Secondary | ICD-10-CM

## 2017-06-13 DIAGNOSIS — L309 Dermatitis, unspecified: Secondary | ICD-10-CM | POA: Diagnosis not present

## 2017-06-27 ENCOUNTER — Encounter: Payer: Self-pay | Admitting: Family Medicine

## 2017-06-27 ENCOUNTER — Ambulatory Visit (INDEPENDENT_AMBULATORY_CARE_PROVIDER_SITE_OTHER): Payer: Medicare Other | Admitting: Family Medicine

## 2017-06-27 VITALS — BP 118/60 | HR 87 | Temp 98.3°F | Resp 16 | Ht 62.0 in | Wt 152.1 lb

## 2017-06-27 DIAGNOSIS — H01004 Unspecified blepharitis left upper eyelid: Secondary | ICD-10-CM

## 2017-06-27 DIAGNOSIS — Z23 Encounter for immunization: Secondary | ICD-10-CM | POA: Diagnosis not present

## 2017-06-27 DIAGNOSIS — H01001 Unspecified blepharitis right upper eyelid: Secondary | ICD-10-CM | POA: Diagnosis not present

## 2017-06-27 MED ORDER — ERYTHROMYCIN 5 MG/GM OP OINT: 1.0000 "application " | TOPICAL_OINTMENT | Freq: Every day | OPHTHALMIC | 0 refills | Status: AC

## 2017-06-27 NOTE — Progress Notes (Signed)
ACUTE VISIT   HPI:  Chief Complaint  Patient presents with  . Red eyes    Amber Leach is a 81 y.o. female, who is here today with her daughter complaining of 7-10 days of bilateral eye erythema, eye lids. Crusty clear/light yellowish discharge. No conjunctival erythema or pruritus. Pain is worse when she is trying to removed crusty matter from eye lids. Mild edema. Problem is constant, worse in the morning, and stable.  Intermittent blurry vision, far vision. Last eye exam about 4 years ago.  She denies using new eye make up, sick contact,or trauma.  Problem started on left eye and same days noted on right eye. Denies associated headache, nausea,vomiting,or blindness.  She has not used OTC treatment. Denies prior Hx of similar problem. Hx of hand eczema.  She denies chills,fever,or body aches. + Nose mucosa dryness.  Problem has been stable.  Review of Systems  Constitutional: Negative for activity change, appetite change, fatigue and fever.  HENT: Negative for congestion, mouth sores, postnasal drip, sinus pressure, sore throat, trouble swallowing and voice change.   Eyes: Positive for discharge, redness and visual disturbance. Negative for photophobia and itching.  Respiratory: Negative for cough, shortness of breath and wheezing.   Cardiovascular: Negative for leg swelling.  Gastrointestinal: Negative for abdominal pain, nausea and vomiting.  Musculoskeletal: Negative for myalgias and neck pain.  Skin: Negative for rash.  Allergic/Immunologic: Negative for environmental allergies.  Neurological: Negative for syncope, weakness and headaches.  Hematological: Negative for adenopathy.      Current Outpatient Prescriptions on File Prior to Visit  Medication Sig Dispense Refill  . amLODipine (NORVASC) 5 MG tablet TAKE ONE TABLET BY MOUTH DAILY 90 tablet 1  . aspirin EC 81 MG tablet Take 81 mg by mouth daily.    . clobetasol cream  (TEMOVATE) 0.93 % Apply 1 application topically 2 (two) times daily. 14 days 45 g 1  . lisinopril (PRINIVIL,ZESTRIL) 20 MG tablet Take 1 tablet (20 mg total) by mouth daily. 90 tablet 2  . pravastatin (PRAVACHOL) 40 MG tablet TAKE ONE TABLET BY MOUTH DAILY 90 tablet 2   No current facility-administered medications on file prior to visit.      Past Medical History:  Diagnosis Date  . Hyperlipidemia   . Hypertension    Allergies  Allergen Reactions  . Sulfa Antibiotics   . Vicodin [Hydrocodone-Acetaminophen] Nausea And Vomiting    Social History   Social History  . Marital status: Married    Spouse name: 2  . Number of children: N/A  . Years of education: N/A   Occupational History  . Retired    Social History Main Topics  . Smoking status: Former Smoker    Types: Cigarettes    Quit date: 2008  . Smokeless tobacco: Never Used  . Alcohol use No  . Drug use: No  . Sexual activity: Not Currently   Other Topics Concern  . None   Social History Narrative  . None    Vitals:   06/27/17 1516  BP: 118/60  Pulse: 87  Resp: 16  Temp: 98.3 F (36.8 C)  SpO2: 98%   Body mass index is 27.82 kg/m.    Physical Exam  Nursing note and vitals reviewed. Constitutional: She is oriented to person, place, and time. She appears well-developed. She does not appear ill. No distress.  HENT:  Head: Normocephalic and atraumatic.  Mouth/Throat: Oropharynx is clear and moist and mucous membranes are normal.  Eyes: Pupils are equal, round, and reactive to light. Conjunctivae and EOM are normal. No foreign body present in the right eye. No foreign body present in the left eye.  Edge of upper eye lid with crusty yellowish material, mild edema, no tenderness.  Neck: No muscular tenderness present. No edema and no erythema present.  Cardiovascular: Normal rate and regular rhythm.   Respiratory: Effort normal and breath sounds normal. No respiratory distress.  Lymphadenopathy:        Head (right side): No preauricular and no posterior auricular adenopathy present.       Head (left side): No preauricular and no posterior auricular adenopathy present.    She has no cervical adenopathy.  Neurological: She is alert and oriented to person, place, and time. She has normal strength.  Skin: Skin is warm. No rash noted. No erythema.  Psychiatric: She has a normal mood and affect. Her speech is normal.  Well groomed, good eye contact.      ASSESSMENT AND PLAN:   Amber Leach was seen today for red eyes.  Diagnoses and all orders for this visit:  Blepharitis of upper eyelids of both eyes, unspecified type  We discussed possible etiologies, ? Allergic, seborrheic dermatitis, and bacterial among some. Topical abx oint recommended. She needs to arrange eye exam. Wash eye lids with bay shampoo Johnson daily. Warm compresses as needed.  Instructed about warning signs.   Visual Acuity Screening   Right eye Left eye Both eyes  Without correction:     With correction: 20/25 20/25 20/30     -     erythromycin (ROMYCIN) ophthalmic ointment; Place 1 application into both eyes at bedtime.  Need for influenza vaccination -     Flu vaccine HIGH DOSE PF    -AmberSyniah Berne Leach was advised to seek immediate medical attention if sudden worsening symptoms or to follow if they persist or if new concerns arise.       Emanuelle Hammerstrom G. Martin[place], MD  [facility]. [place] office.

## 2017-06-27 NOTE — Patient Instructions (Addendum)
A few things to remember from today's visit:   Blepharitis of upper eyelids of both eyes, unspecified type - Plan: erythromycin [facility]) ophthalmic ointment  Need for influenza vaccination - Plan: Flu vaccine HIGH DOSE PF   Blepharitis Blepharitis is inflammation of the eyelids. Blepharitis may happen with:  Reddish, scaly skin around the scalp and eyebrows.  Burning or itching of the eyelids.  Eye discharge at night that causes the eyelashes to stick together in the morning.  Eyelashes that fall out.  Sensitivity to light.  Follow these instructions at home: Pay attention to any changes in how you look or feel. Follow these instructions to help with your condition: Keeping Clean  Wash your hands often.  Wash your eyelids with warm water or with warm water that is mixed with a small amount of baby shampoo. Do this two times per day or as often as needed.  Wash your face and eyebrows at least once a day.  Use a clean towel each time you dry your eyelids. Do not use this towel to clean or dry other areas of your body. Do not share your towel with anyone. General instructions  Avoid wearing makeup until you get better. Do not share makeup with anyone.  Avoid rubbing your eyes.  Apply warm compresses to your eyes 2 times per day for 10 minutes at a time, or as told by your health care provider.  If you were prescribed an antibiotic ointment or steroid drops, apply or use the medicine as told by your health care provider. Do not stop using the medicine even if you feel better.  Keep all follow-up visits as told by your health care provider. This is important. Contact a health care provider if:  Your eyelids feel hot.  You have blisters or a rash on your eyelids.  The condition does not go away in 2-4 days.  The inflammation gets worse. Get help right away if:  You have pain or redness that gets worse or spreads to other parts of your face.  Your vision  changes.  You have pain when looking at lights or moving objects.  You have a fever. This information is not intended to replace advice given to you by your health care provider. Make sure you discuss any questions you have with your health care provider. Document Released: 08/12/2000 Document Revised: 01/21/2016 Document Reviewed: 12/08/2014 Elsevier Interactive Patient Education  [employer].  Please be sure medication list is accurate. If a new problem present, please set up appointment sooner than planned today.

## 2017-07-10 DIAGNOSIS — H2513 Age-related nuclear cataract, bilateral: Secondary | ICD-10-CM | POA: Diagnosis not present

## 2017-07-10 DIAGNOSIS — H40023 Open angle with borderline findings, high risk, bilateral: Secondary | ICD-10-CM | POA: Diagnosis not present

## 2017-07-10 DIAGNOSIS — H25013 Cortical age-related cataract, bilateral: Secondary | ICD-10-CM | POA: Diagnosis not present

## 2017-07-10 DIAGNOSIS — H268 Other specified cataract: Secondary | ICD-10-CM | POA: Diagnosis not present

## 2017-07-10 DIAGNOSIS — H35033 Hypertensive retinopathy, bilateral: Secondary | ICD-10-CM | POA: Diagnosis not present

## 2017-07-16 ENCOUNTER — Other Ambulatory Visit: Payer: Self-pay | Admitting: Family Medicine

## 2017-07-16 DIAGNOSIS — I10 Essential (primary) hypertension: Secondary | ICD-10-CM

## 2017-08-29 NOTE — Progress Notes (Signed)
HPI:   Amber Leach is a 82 y.o. female, who is here today with her daughter for 6 months follow up.   She was last seen on 06/27/17 for acute visit.  Since her last OV she has not had new problems.  Hypertension:    Chronic problem. Currently on Amlodipine 5 mg and Lisinopril 20 mg daily.  Home BP is 130's/70's.  Last eye exam 6 weeks ago, left eye abnormality and planing on following In 12/2017.  She is taking medications as instructed, no side effects reported.  She has not noted unusual headache, visual changes, exertional chest pain, dyspnea,  focal weakness, or edema.   Lab Results  Component Value Date   CREATININE 1.20 02/27/2017   BUN 20 02/27/2017   NA 141 02/27/2017   K 4.4 02/27/2017   CL 105 02/27/2017   CO2 28 02/27/2017   CKD III: She has not noted foam in urine or gross hematuria.  She has no concerns today.   Review of Systems  Constitutional: Negative for activity change, appetite change, fatigue and fever.  HENT: Negative for mouth sores, nosebleeds and trouble swallowing.   Eyes: Negative for redness and visual disturbance.  Respiratory: Negative for cough, shortness of breath and wheezing.   Cardiovascular: Negative for chest pain, palpitations and leg swelling.  Gastrointestinal: Negative for abdominal pain, nausea and vomiting.       Negative for changes in bowel habits.  Endocrine: Negative for cold intolerance and heat intolerance.  Genitourinary: Negative for decreased urine volume and hematuria.  Neurological: Negative for syncope, weakness and headaches.  Psychiatric/Behavioral: Negative for confusion. The patient is nervous/anxious.     Current Outpatient Medications on File Prior to Visit  Medication Sig Dispense Refill  . amLODipine (NORVASC) 5 MG tablet TAKE 1 TABLET BY MOUTH ONCE DAILY 90 tablet 1  . aspirin EC 81 MG tablet Take 81 mg by mouth daily.    . clobetasol cream (TEMOVATE) 4.74 % Apply 1  application topically 2 (two) times daily. 14 days 45 g 1  . lisinopril (PRINIVIL,ZESTRIL) 20 MG tablet Take 1 tablet (20 mg total) by mouth daily. 90 tablet 2  . pravastatin (PRAVACHOL) 40 MG tablet TAKE ONE TABLET BY MOUTH DAILY 90 tablet 2   No current facility-administered medications on file prior to visit.      Past Medical History:  Diagnosis Date  . Hyperlipidemia   . Hypertension    Allergies  Allergen Reactions  . Sulfa Antibiotics   . Vicodin [Hydrocodone-Acetaminophen] Nausea And Vomiting    Social History   Socioeconomic History  . Marital status: Married    Spouse name: 2  . Number of children: None  . Years of education: None  . Highest education level: None  Social Needs  . Financial resource strain: None  . Food insecurity - worry: None  . Food insecurity - inability: None  . Transportation needs - medical: None  . Transportation needs - non-medical: None  Occupational History  . Occupation: Retired  Tobacco Use  . Smoking status: Former Smoker    Types: Cigarettes    Last attempt to quit: 2008    Years since quitting: 11.0  . Smokeless tobacco: Never Used  Substance and Sexual Activity  . Alcohol use: No  . Drug use: No  . Sexual activity: Not Currently  Other Topics Concern  . None  Social History Narrative  . None    Vitals:   08/30/17 2595  BP: 122/70  Pulse: 94  Resp: 12  Temp: 98.4 F (36.9 C)  SpO2: 96%   Body mass index is 28.21 kg/m.   Physical Exam  Nursing note and vitals reviewed. Constitutional: She is oriented to person, place, and time. She appears well-developed. No distress.  HENT:  Head: Normocephalic and atraumatic.  Mouth/Throat: Oropharynx is clear and moist and mucous membranes are normal.  Eyes: Conjunctivae are normal. Pupils are equal, round, and reactive to light.  Cardiovascular: Normal rate and regular rhythm.  No murmur heard. Pulses:      Dorsalis pedis pulses are 2+ on the right side, and 2+ on  the left side.  Respiratory: Effort normal and breath sounds normal. No respiratory distress.  GI: Soft. She exhibits no mass. There is no hepatomegaly. There is no tenderness.  Musculoskeletal: She exhibits no edema or tenderness.  Lymphadenopathy:    She has no cervical adenopathy.  Neurological: She is alert and oriented to person, place, and time. She has normal strength.  Stable gait without assistance needed.  Skin: Skin is warm. No erythema.  Psychiatric: Her mood appears anxious.  Well groomed, good eye contact.     ASSESSMENT AND PLAN:   Amber Leach was seen today for 6 months follow-up.   Diagnoses and all orders for this visit:  Lab Results  Component Value Date   CREATININE 1.17 08/30/2017   BUN 23 08/30/2017   NA 139 08/30/2017   K 4.8 08/30/2017   CL 103 08/30/2017   CO2 27 08/30/2017    Essential hypertension  Adequately controlled. No changes in current management. DASH-low salt diet recommended to continue. Eye exam current. F/U in 6 months, before if needed.  -     Basic metabolic panel  CKD (chronic kidney disease), stage III (HCC)  Stable. Adequate BP control,avoidance of NSAID's,continue low salt diet and Lisinopril. Adequate hydration. Further recommendations will be given according to labs/imaging results.  -     Basic metabolic panel  Need for vaccination with 13-polyvalent pneumococcal conjugate vaccine -     Pneumococcal conjugate vaccine 13-valent IM    -Ms. Azeneth Carbonell Tenpas was advised to return sooner than planned today if new concerns arise.       [name], MD  [facility]. [place] office.

## 2017-08-30 ENCOUNTER — Ambulatory Visit (INDEPENDENT_AMBULATORY_CARE_PROVIDER_SITE_OTHER): Payer: Medicare Other | Admitting: Family Medicine

## 2017-08-30 ENCOUNTER — Encounter: Payer: Self-pay | Admitting: Family Medicine

## 2017-08-30 VITALS — BP 122/70 | HR 94 | Temp 98.4°F | Resp 12 | Ht 62.0 in | Wt 154.2 lb

## 2017-08-30 DIAGNOSIS — Z23 Encounter for immunization: Secondary | ICD-10-CM | POA: Diagnosis not present

## 2017-08-30 DIAGNOSIS — N183 Chronic kidney disease, stage 3 unspecified: Secondary | ICD-10-CM

## 2017-08-30 DIAGNOSIS — I10 Essential (primary) hypertension: Secondary | ICD-10-CM | POA: Diagnosis not present

## 2017-08-30 LAB — BASIC METABOLIC PANEL
BUN: 23 mg/dL (ref 6–23)
CALCIUM: 9.8 mg/dL (ref 8.4–10.5)
CO2: 27 meq/L (ref 19–32)
CREATININE: 1.17 mg/dL (ref 0.40–1.20)
Chloride: 103 mEq/L (ref 96–112)
GFR: 47.35 mL/min — ABNORMAL LOW (ref >60.00)
GLUCOSE: 85 mg/dL (ref 70–99)
Potassium: 4.8 mEq/L (ref 3.5–5.1)
Sodium: 139 mEq/L (ref 135–145)

## 2017-08-30 NOTE — Patient Instructions (Addendum)
A few things to remember from today's visit:   Essential hypertension - Plan: Basic metabolic panel  CKD (chronic kidney disease), stage III (HCC) - Plan: Basic metabolic panel    Today he received Prevnar vaccine, next year he will receive Pneumovax.  No changes in your back in July 2019.   Please be sure medication list is accurate. If a new problem present, please set up appointment sooner than planned today.

## 2017-10-20 DIAGNOSIS — H02839 Dermatochalasis of unspecified eye, unspecified eyelid: Secondary | ICD-10-CM | POA: Diagnosis not present

## 2017-10-20 DIAGNOSIS — H11153 Pinguecula, bilateral: Secondary | ICD-10-CM | POA: Diagnosis not present

## 2017-10-20 DIAGNOSIS — H1851 Endothelial corneal dystrophy: Secondary | ICD-10-CM | POA: Diagnosis not present

## 2017-10-27 DIAGNOSIS — H40023 Open angle with borderline findings, high risk, bilateral: Secondary | ICD-10-CM | POA: Diagnosis not present

## 2018-01-14 ENCOUNTER — Other Ambulatory Visit: Payer: Self-pay | Admitting: Family Medicine

## 2018-01-14 DIAGNOSIS — I10 Essential (primary) hypertension: Secondary | ICD-10-CM

## 2018-01-18 DIAGNOSIS — H40023 Open angle with borderline findings, high risk, bilateral: Secondary | ICD-10-CM | POA: Diagnosis not present

## 2018-01-18 DIAGNOSIS — H40033 Anatomical narrow angle, bilateral: Secondary | ICD-10-CM | POA: Diagnosis not present

## 2018-01-18 DIAGNOSIS — H268 Other specified cataract: Secondary | ICD-10-CM | POA: Diagnosis not present

## 2018-01-23 ENCOUNTER — Other Ambulatory Visit: Payer: Self-pay | Admitting: Family Medicine

## 2018-01-23 DIAGNOSIS — E785 Hyperlipidemia, unspecified: Secondary | ICD-10-CM

## 2018-02-14 ENCOUNTER — Other Ambulatory Visit: Payer: Self-pay | Admitting: Family Medicine

## 2018-02-14 DIAGNOSIS — I10 Essential (primary) hypertension: Secondary | ICD-10-CM

## 2018-02-27 ENCOUNTER — Ambulatory Visit: Payer: Medicare Other | Admitting: Family Medicine

## 2018-03-02 ENCOUNTER — Ambulatory Visit: Payer: Medicare Other | Admitting: Family Medicine

## 2018-03-09 ENCOUNTER — Ambulatory Visit (INDEPENDENT_AMBULATORY_CARE_PROVIDER_SITE_OTHER): Payer: Medicare Other | Admitting: Family Medicine

## 2018-03-09 ENCOUNTER — Encounter: Payer: Self-pay | Admitting: Family Medicine

## 2018-03-09 VITALS — BP 120/70 | HR 93 | Temp 98.4°F | Resp 12 | Ht 62.0 in | Wt 149.4 lb

## 2018-03-09 DIAGNOSIS — N183 Chronic kidney disease, stage 3 unspecified: Secondary | ICD-10-CM

## 2018-03-09 DIAGNOSIS — I10 Essential (primary) hypertension: Secondary | ICD-10-CM | POA: Diagnosis not present

## 2018-03-09 DIAGNOSIS — E7849 Other hyperlipidemia: Secondary | ICD-10-CM

## 2018-03-09 LAB — MICROALBUMIN / CREATININE URINE RATIO
CREATININE, U: 87.9 mg/dL
MICROALB/CREAT RATIO: 0.8 mg/g (ref 0.0–30.0)

## 2018-03-09 LAB — LIPID PANEL
CHOLESTEROL: 165 mg/dL (ref 0–200)
HDL: 69 mg/dL (ref >39.00)
LDL Cholesterol: 72 mg/dL (ref 0–99)
NonHDL: 95.56
Total CHOL/HDL Ratio: 2
Triglycerides: 116 mg/dL (ref 0.0–149.0)
VLDL: 23.2 mg/dL (ref 0.0–40.0)

## 2018-03-09 LAB — COMPREHENSIVE METABOLIC PANEL
ALBUMIN: 3.9 g/dL (ref 3.5–5.2)
ALT: 11 U/L (ref 0–35)
AST: 23 U/L (ref 0–37)
Alkaline Phosphatase: 86 U/L (ref 39–117)
BILIRUBIN TOTAL: 0.5 mg/dL (ref 0.2–1.2)
BUN: 21 mg/dL (ref 6–23)
CO2: 28 mEq/L (ref 19–32)
Calcium: 9.9 mg/dL (ref 8.4–10.5)
Chloride: 106 mEq/L (ref 96–112)
Creatinine, Ser: 1.2 mg/dL (ref 0.40–1.20)
GFR: 45.92 mL/min — ABNORMAL LOW (ref >60.00)
Glucose, Bld: 92 mg/dL (ref 70–99)
POTASSIUM: 5.1 meq/L (ref 3.5–5.1)
SODIUM: 141 meq/L (ref 135–145)
Total Protein: 7.2 g/dL (ref 6.0–8.3)

## 2018-03-09 LAB — VITAMIN D 25 HYDROXY (VIT D DEFICIENCY, FRACTURES): VITD: 22.63 ng/mL — AB (ref 30.00–100.00)

## 2018-03-09 NOTE — Patient Instructions (Signed)
A few things to remember from today's visit:   Essential hypertension - Plan: Comprehensive metabolic panel  Other hyperlipidemia - Plan: Comprehensive metabolic panel, Lipid panel  CKD (chronic kidney disease), stage III (HCC) - Plan: Comprehensive metabolic panel, Microalbumin / creatinine urine ratio, VITAMIN D 25 Hydroxy (Vit-D Deficiency, Fractures)  Aspirin discontinued.  Rest no changes.   Please be sure medication list is accurate. If a new problem present, please set up appointment sooner than planned today.

## 2018-03-09 NOTE — Progress Notes (Signed)
HPI:   [name] is a 82 y.o. female, who is here today with her daughter for 6 months follow up.   She was last seen in 08/2016.  Hyperlipidemia: Currently she is on pravastatin 40 mg daily. She is following low-fat diet. She has not noted side effects from medications.  She is currently on Aspirin 81 mg daily, denies history of CVD.  Lab Results  Component Value Date   CHOL 162 02/27/2017   HDL 68.20 02/27/2017   LDLCALC 73 02/27/2017   TRIG 106.0 02/27/2017   CHOLHDL 2 02/27/2017    Hypertension:     Currently on Amlodipine 5 mg and Lisinopril 20 mg daily   Home BPs 130s/70s. Last eye exam in 12/2017, next appointment in 2 months to follow on glaucoma. She is taking medications as instructed, no side effects reported.  She has not noted unusual headache, visual changes, exertional chest pain, dyspnea,  focal weakness, or edema.   Lab Results  Component Value Date   CREATININE 1.17 08/30/2017   BUN 23 08/30/2017   NA 139 08/30/2017   K 4.8 08/30/2017   CL 103 08/30/2017   CO2 27 08/30/2017    CKD 3, she is currently on lisinopril. She has not noted gross hematuria, form in urine, or decreased urine output.     Review of Systems  Constitutional: Negative for activity change, appetite change, fatigue and fever.  HENT: Negative for mouth sores, nosebleeds and trouble swallowing.   Eyes: Negative for redness and visual disturbance.  Respiratory: Negative for cough, shortness of breath and wheezing.   Cardiovascular: Negative for chest pain, palpitations and leg swelling.  Gastrointestinal: Negative for abdominal pain, nausea and vomiting.       Negative for changes in bowel habits.  Genitourinary: Negative for decreased urine volume, dysuria and hematuria.  Neurological: Negative for syncope, weakness and headaches.      Current Outpatient Medications on File Prior to Visit  Medication Sig Dispense Refill  . amLODipine  (NORVASC) 5 MG tablet TAKE 1 TABLET BY MOUTH ONCE DAILY 90 tablet 1  . lisinopril (PRINIVIL,ZESTRIL) 20 MG tablet TAKE 1 TABLET BY MOUTH DAILY 90 tablet 2  . pravastatin (PRAVACHOL) 40 MG tablet TAKE 1 TABLET BY MOUTH ONCE DAILY 90 tablet 2   No current facility-administered medications on file prior to visit.      Past Medical History:  Diagnosis Date  . Hyperlipidemia   . Hypertension    Allergies  Allergen Reactions  . Sulfa Antibiotics   . Vicodin [Hydrocodone-Acetaminophen] Nausea And Vomiting    Social History   Socioeconomic History  . Marital status: Married    Spouse name: 2  . Number of children: Not on file  . Years of education: Not on file  . Highest education level: Not on file  Occupational History  . Occupation: Retired  Scientific laboratory technician  . Financial resource strain: Not on file  . Food insecurity:    Worry: Not on file    Inability: Not on file  . Transportation needs:    Medical: Not on file    Non-medical: Not on file  Tobacco Use  . Smoking status: Former Smoker    Types: Cigarettes    Last attempt to quit: 2008    Years since quitting: 11.5  . Smokeless tobacco: Never Used  Substance and Sexual Activity  . Alcohol use: No  . Drug use: No  . Sexual activity: Not Currently  Lifestyle  .  Physical activity:    Days per week: Not on file    Minutes per session: Not on file  . Stress: Not on file  Relationships  . Social connections:    Talks on phone: Not on file    Gets together: Not on file    Attends religious service: Not on file    Active member of club or organization: Not on file    Attends meetings of clubs or organizations: Not on file    Relationship status: Not on file  Other Topics Concern  . Not on file  Social History Narrative  . Not on file    Vitals:   03/09/18 0851  BP: 120/70  Pulse: 93  Resp: 12  Temp: 98.4 F (36.9 C)  SpO2: 96%   Body mass index is 27.32 kg/m.   Physical Exam  Nursing note and vitals  reviewed. Constitutional: She is oriented to person, place, and time. She appears well-developed. No distress.  HENT:  Head: Normocephalic and atraumatic.  Mouth/Throat: Oropharynx is clear and moist and mucous membranes are normal.  Eyes: Pupils are equal, round, and reactive to light. Conjunctivae are normal.  Cardiovascular: Normal rate and regular rhythm.  No murmur heard. Pulses:      Dorsalis pedis pulses are 2+ on the right side, and 2+ on the left side.  Respiratory: Effort normal and breath sounds normal. No respiratory distress.  GI: Soft. She exhibits no mass. There is no hepatomegaly. There is no tenderness.  Musculoskeletal: She exhibits no edema.  Lymphadenopathy:    She has no cervical adenopathy.  Neurological: She is alert and oriented to person, place, and time. She has normal strength. Gait normal.  Skin: Skin is warm. No erythema.  Psychiatric: She has a normal mood and affect.  Well groomed, good eye contact.     ASSESSMENT AND PLAN:   [name] was seen today for 6 months follow-up.  Orders Placed This Encounter  Procedures  . Comprehensive metabolic panel  . Lipid panel  . Microalbumin / creatinine urine ratio  . VITAMIN D 25 Hydroxy (Vit-D Deficiency, Fractures)   Lab Results  Component Value Date   ALT 11 03/09/2018   AST 23 03/09/2018   ALKPHOS 86 03/09/2018   BILITOT 0.5 03/09/2018   Lab Results  Component Value Date   CREATININE 1.20 03/09/2018   BUN 21 03/09/2018   NA 141 03/09/2018   K 5.1 03/09/2018   CL 106 03/09/2018   CO2 28 03/09/2018   Lab Results  Component Value Date   MICROALBUR <0.7 03/09/2018   Lab Results  Component Value Date   CHOL 165 03/09/2018   HDL 69.00 03/09/2018   LDLCALC 72 03/09/2018   TRIG 116.0 03/09/2018   CHOLHDL 2 03/09/2018    Essential hypertension Adequately controlled. She will continue lisinopril 20 mg daily and Amlodipine 5 mg daily. Continue low-salt diet. Eye exam  is current. Follow-up in 6 months.  Hyperlipidemia She will continue pravastatin 40 mg daily.  Follow-up in 6 to 12 months depending on lipid panel results.  CKD (chronic kidney disease), stage III Problem has been stable. Continue lisinopril 20 mg daily. Low-salt diet and avoidance of NSAIDs as well as adequate hydration. Further recommendations will be given according to BMP results.      Rimsha Trembley G. Martinique, MD  [facility]. [place] office.

## 2018-03-09 NOTE — Assessment & Plan Note (Signed)
Adequately controlled. She will continue lisinopril 20 mg daily and Amlodipine 5 mg daily. Continue low-salt diet. Eye exam is current. Follow-up in 6 months.

## 2018-03-09 NOTE — Assessment & Plan Note (Signed)
She will continue pravastatin 40 mg daily.  Follow-up in 6 to 12 months depending on lipid panel results.

## 2018-03-09 NOTE — Assessment & Plan Note (Signed)
Problem has been stable. Continue lisinopril 20 mg daily. Low-salt diet and avoidance of NSAIDs as well as adequate hydration. Further recommendations will be given according to BMP results.

## 2018-03-12 ENCOUNTER — Telehealth: Payer: Self-pay | Admitting: Family Medicine

## 2018-03-12 NOTE — Telephone Encounter (Signed)
Returned call to pt.  Advised per result note of Dr. Martinique on 03/11/18, she should be taking Vita D 2000 units/day.  Recommended to continue her MVI with 1000 units of Vitamin D, and add an additional dose of Vita D 1000 units, to equal 2000 units of Vital D per day.  Pt. Verb. Understanding.

## 2018-03-12 NOTE — Telephone Encounter (Signed)
Copied from [place] [id number]. Topic: Quick Communication - See Telephone Encounter >> Mar 12, 2018 11:09 AM Mylinda Latina, NT wrote: CRM for notification. See Telephone encounter for: 03/12/18. Patient called and states she was given her lab results and it states that her Vitamin D is low. Patient currently take a multivitamin that has 1000mg  of Vitamin D and she is wondering if she should buy an additional 1000mg  of vitamin D or take 2 fo those multivitamin. CB# [phone] Please advise. Thank you

## 2018-04-17 DIAGNOSIS — H40023 Open angle with borderline findings, high risk, bilateral: Secondary | ICD-10-CM | POA: Diagnosis not present

## 2018-05-24 DIAGNOSIS — D485 Neoplasm of uncertain behavior of skin: Secondary | ICD-10-CM | POA: Diagnosis not present

## 2018-05-24 DIAGNOSIS — C44319 Basal cell carcinoma of skin of other parts of face: Secondary | ICD-10-CM | POA: Diagnosis not present

## 2018-07-17 ENCOUNTER — Other Ambulatory Visit: Payer: Self-pay | Admitting: Family Medicine

## 2018-07-17 DIAGNOSIS — I10 Essential (primary) hypertension: Secondary | ICD-10-CM

## 2018-07-18 ENCOUNTER — Encounter: Payer: Self-pay | Admitting: Family Medicine

## 2018-07-18 ENCOUNTER — Ambulatory Visit (INDEPENDENT_AMBULATORY_CARE_PROVIDER_SITE_OTHER): Payer: Medicare Other | Admitting: Family Medicine

## 2018-07-18 VITALS — BP 118/66 | HR 86 | Temp 98.0°F | Resp 12 | Ht 62.0 in | Wt 148.0 lb

## 2018-07-18 DIAGNOSIS — J988 Other specified respiratory disorders: Secondary | ICD-10-CM

## 2018-07-18 MED ORDER — DOXYCYCLINE HYCLATE 100 MG PO TABS: 100.0000 mg | ORAL_TABLET | Freq: Two times a day (BID) | ORAL | 0 refills | Status: AC

## 2018-07-18 NOTE — Progress Notes (Signed)
ACUTE VISIT  HPI:  Chief Complaint  Patient presents with  . Cough    with yellow mucus, sx started on Saturday  . Chest congestion    Amber Leach is a 82 y.o.female here today with her daughter complaining of 4 days of respiratory symptoms.  Saturday she started with productive cough with yellowish sputum,denies hemoptysis. Cough exacerbated by exertion. No alleviating factors.  Today morning fever of 100 F.  Nasal congestion,rhinorrhea,and post nasal drainage.  Denies chest pain,dyspnea,or wheezing.  Cough  This is a new problem. The current episode started in the past 7 days. The problem has been gradually worsening. The cough is productive of sputum. Associated symptoms include a fever, nasal congestion, postnasal drip, rhinorrhea and a sore throat. Pertinent negatives include no chest pain, ear pain, eye redness, headaches, heartburn, hemoptysis, myalgias, rash, shortness of breath or wheezing. The symptoms are aggravated by exercise. There is no history of environmental allergies.   No Hx of recent travel. No sick contact. No known insect bite.  Hx of allergies: No respiratory allergies.Hx of eczema.  OTC medications for this problem: Ibuprofen today am.    Review of Systems  Constitutional: Positive for fatigue and fever. Negative for activity change and appetite change.  HENT: Positive for congestion, postnasal drip, rhinorrhea, sinus pressure and sore throat. Negative for ear pain, mouth sores, trouble swallowing and voice change.   Eyes: Negative for discharge and redness.  Respiratory: Positive for cough. Negative for hemoptysis, shortness of breath and wheezing.   Cardiovascular: Negative for chest pain.  Gastrointestinal: Negative for abdominal pain, diarrhea, heartburn, nausea and vomiting.  Musculoskeletal: Negative for gait problem and myalgias.  Skin: Negative for rash.  Allergic/Immunologic: Negative for environmental  allergies.  Neurological: Negative for weakness and headaches.      Current Outpatient Medications on File Prior to Visit  Medication Sig Dispense Refill  . amLODipine (NORVASC) 5 MG tablet TAKE 1 TABLET BY MOUTH ONCE DAILY 90 tablet 1  . lisinopril (PRINIVIL,ZESTRIL) 20 MG tablet TAKE 1 TABLET BY MOUTH DAILY 90 tablet 2  . pravastatin (PRAVACHOL) 40 MG tablet TAKE 1 TABLET BY MOUTH ONCE DAILY 90 tablet 2   No current facility-administered medications on file prior to visit.      Past Medical History:  Diagnosis Date  . Hyperlipidemia   . Hypertension    Allergies  Allergen Reactions  . Sulfa Antibiotics   . Vicodin [Hydrocodone-Acetaminophen] Nausea And Vomiting    Social History   Socioeconomic History  . Marital status: Married    Spouse name: 2  . Number of children: Not on file  . Years of education: Not on file  . Highest education level: Not on file  Occupational History  . Occupation: Retired  Scientific laboratory technician  . Financial resource strain: Not on file  . Food insecurity:    Worry: Not on file    Inability: Not on file  . Transportation needs:    Medical: Not on file    Non-medical: Not on file  Tobacco Use  . Smoking status: Former Smoker    Types: Cigarettes    Last attempt to quit: 2008    Years since quitting: 11.9  . Smokeless tobacco: Never Used  Substance and Sexual Activity  . Alcohol use: No  . Drug use: No  . Sexual activity: Not Currently  Lifestyle  . Physical activity:    Days per week: Not on file    Minutes per session:  Not on file  . Stress: Not on file  Relationships  . Social connections:    Talks on phone: Not on file    Gets together: Not on file    Attends religious service: Not on file    Active member of club or organization: Not on file    Attends meetings of clubs or organizations: Not on file    Relationship status: Not on file  Other Topics Concern  . Not on file  Social History Narrative  . Not on file    Vitals:    07/18/18 1540  BP: 118/66  Pulse: 86  Resp: 12  Temp: 98 F (36.7 C)  SpO2: 96%   Body mass index is 27.07 kg/m.   Physical Exam  Nursing note and vitals reviewed. Constitutional: She is oriented to person, place, and time. She appears well-developed. She does not appear ill. No distress.  HENT:  Head: Atraumatic.  Right Ear: Tympanic membrane, external ear and ear canal normal.  Left Ear: Tympanic membrane, external ear and ear canal normal.  Nose: Rhinorrhea and septal deviation present. Right sinus exhibits no maxillary sinus tenderness and no frontal sinus tenderness. Left sinus exhibits no maxillary sinus tenderness and no frontal sinus tenderness.  Mouth/Throat: Oropharynx is clear and moist and mucous membranes are normal.  Hypertrophic turbinates. Hyperemic nasal mucosa. Nasal voice. Postnasal drainage.  Eyes: Conjunctivae are normal.  Cardiovascular: Normal rate and regular rhythm.  No murmur heard. Respiratory: Effort normal and breath sounds normal. No respiratory distress.  Lymphadenopathy:       Head (right side): No submandibular adenopathy present.       Head (left side): No submandibular adenopathy present.    She has no cervical adenopathy.  Neurological: She is alert and oriented to person, place, and time. She has normal strength.  Skin: Skin is warm. No rash noted. No erythema.  Psychiatric: She has a normal mood and affect.  Well groomed, good eye contact.      ASSESSMENT AND PLAN:   Amber Leach was seen today for cough and chest congestion.  Diagnoses and all orders for this visit:  Respiratory tract infection -     doxycycline (VIBRA-TABS) 100 MG tablet; Take 1 tablet (100 mg total) by mouth 2 (two) times daily for 7 days.   Explained that respiratory symptoms could be viral etiology, in which case symptomatic treatment recommended.  On physical examination today no findings to suggest a bacterial infection but because new onset of fever  + reporting worsening symptoms, I am recommending oral antibiotics.  Adequate hydration and rest. Instructed about warning signs.  We will hold on CXR.  F/U as needed.     Cynia Abruzzo G. Martinique, MD  [facility]. [place] office.

## 2018-07-18 NOTE — Patient Instructions (Signed)
A few things to remember from today's visit:   Respiratory tract infection - Plan: doxycycline (VIBRA-TABS) 100 MG tablet  This could be still a viral illness, in which case he will not respond to antibiotic treatment. Take medication with food. Saline nasal irrigation several times per day. Please follow-up if you are still coughing in 2 to 3 weeks.  Please be sure medication list is accurate. If a new problem present, please set up appointment sooner than planned today.

## 2018-07-30 ENCOUNTER — Ambulatory Visit: Payer: Medicare Other

## 2018-07-31 DIAGNOSIS — H40023 Open angle with borderline findings, high risk, bilateral: Secondary | ICD-10-CM | POA: Diagnosis not present

## 2018-07-31 DIAGNOSIS — H25013 Cortical age-related cataract, bilateral: Secondary | ICD-10-CM | POA: Diagnosis not present

## 2018-07-31 DIAGNOSIS — H35363 Drusen (degenerative) of macula, bilateral: Secondary | ICD-10-CM | POA: Diagnosis not present

## 2018-07-31 DIAGNOSIS — H2513 Age-related nuclear cataract, bilateral: Secondary | ICD-10-CM | POA: Diagnosis not present

## 2018-08-16 DIAGNOSIS — C44319 Basal cell carcinoma of skin of other parts of face: Secondary | ICD-10-CM | POA: Diagnosis not present

## 2018-08-20 ENCOUNTER — Ambulatory Visit: Payer: Self-pay

## 2018-08-20 NOTE — Telephone Encounter (Signed)
  Incoming call from Patients Daughter Ander Purpura, who states that her Mother twisted her Left ankle over some rocks. Happened approximately  One hour ago.  Swollen over left ankle bone.  Patient can walk , but states it hurts to walks. Pain rated 5 to 6 .  Skin is intact.  Patient is scheduled for appointment 08/21/18 @9 :15  Am.   Reason for Disposition . [1] Limp when walking AND [2] due to a twisted ankle or foot  Answer Assessment - Initial Assessment Questions 1. MECHANISM: "How did the injury happen?" (e.g., twisting injury, direct blow)      twisted 2. ONSET: "When did the injury happen?" (Minutes or hours ago)      1 hour  3. LOCATION: "Where is the injury located?"      Left bone swoleen 4. APPEARANCE of INJURY: "What does the injury look like?"      No bruising just swelling 5. WEIGHT-BEARING: "Can you put weight on that foot?" "Can you walk (four steps or more)?"       Hurts when she put pressure on can move 6. SIZE: For cuts, bruises, or swelling, ask: "How large is it?" (e.g., inches or centimeters;  entire joint)     Swelling a little bit 7. PAIN: "Is there pain?" If so, ask: "How bad is the pain?"    (e.g., Scale 1-10; or mild, moderate, severe)     5 to 6 not constant 8. TETANUS: For any breaks in the skin, ask: "When was the last tetanus booster?"     Intact skin 9. OTHER SYMPTOMS: "Do you have any other symptoms?"      no 10. PREGNANCY: "Is there any chance you are pregnant?" "When was your last menstrual period?"       na  Protocols used: [place]

## 2018-08-21 ENCOUNTER — Encounter: Payer: Self-pay | Admitting: Family Medicine

## 2018-08-21 ENCOUNTER — Ambulatory Visit (INDEPENDENT_AMBULATORY_CARE_PROVIDER_SITE_OTHER): Payer: Medicare Other

## 2018-08-21 ENCOUNTER — Ambulatory Visit (INDEPENDENT_AMBULATORY_CARE_PROVIDER_SITE_OTHER): Payer: Medicare Other | Admitting: Family Medicine

## 2018-08-21 VITALS — BP 126/70 | HR 100 | Temp 98.9°F | Resp 16 | Ht 62.0 in | Wt 147.0 lb

## 2018-08-21 DIAGNOSIS — S99912A Unspecified injury of left ankle, initial encounter: Secondary | ICD-10-CM

## 2018-08-21 DIAGNOSIS — S9002XA Contusion of left ankle, initial encounter: Secondary | ICD-10-CM | POA: Diagnosis not present

## 2018-08-21 DIAGNOSIS — S93402A Sprain of unspecified ligament of left ankle, initial encounter: Secondary | ICD-10-CM

## 2018-08-21 MED ORDER — DICLOFENAC SODIUM 1 % TD GEL: 4.0000 g | Freq: Four times a day (QID) | TRANSDERMAL | 0 refills | Status: DC

## 2018-08-21 MED ORDER — ANKLE SPLINT/CANVAS SMALL MISC: 1.0000 | Freq: Every day | 0 refills | Status: AC

## 2018-08-21 NOTE — Progress Notes (Signed)
ACUTE VISIT   HPI:  Chief Complaint  Patient presents with  . Twisted Ankle    twisted left ankle yesterday    [name] is a 82 y.o. female, who is here today with her daughter complaining of left ankle pain since yesterday. Pain is started after she twisted her left ankle, she was getting out of her car and tripped. She did not fall but inverted of left ankle. Injury happened around 11 AM. Pain is started about 10 to 15 minutes after injury, lateral malleolus with mild erythema. Pain is constant, dull ache 5/10, exacerbated by certain movements and by walking, 10/10. Alleviated by rest. She has been applying local ice and elevating extremity. Pain seems to be getting worse.   No associated numbness, tingling, or ecchymosis.  Review of Systems  Constitutional: Negative for chills and fever.  Musculoskeletal: Positive for arthralgias. Negative for joint swelling.  Skin: Negative for color change and pallor.  Neurological: Negative for weakness and numbness.  Psychiatric/Behavioral: Negative for confusion and sleep disturbance.     Current Outpatient Medications on File Prior to Visit  Medication Sig Dispense Refill  . amLODipine (NORVASC) 5 MG tablet TAKE 1 TABLET BY MOUTH ONCE DAILY 90 tablet 1  . lisinopril (PRINIVIL,ZESTRIL) 20 MG tablet TAKE 1 TABLET BY MOUTH DAILY 90 tablet 2  . pravastatin (PRAVACHOL) 40 MG tablet TAKE 1 TABLET BY MOUTH ONCE DAILY 90 tablet 2   No current facility-administered medications on file prior to visit.      Past Medical History:  Diagnosis Date  . Hyperlipidemia   . Hypertension    Allergies  Allergen Reactions  . Sulfa Antibiotics   . Vicodin [Hydrocodone-Acetaminophen] Nausea And Vomiting    Social History   Socioeconomic History  . Marital status: Married    Spouse name: 2  . Number of children: Not on file  . Years of education: Not on file  . Highest education level: Not on file    Occupational History  . Occupation: Retired  Scientific laboratory technician  . Financial resource strain: Not on file  . Food insecurity:    Worry: Not on file    Inability: Not on file  . Transportation needs:    Medical: Not on file    Non-medical: Not on file  Tobacco Use  . Smoking status: Former Smoker    Types: Cigarettes    Last attempt to quit: 2008    Years since quitting: 11.9  . Smokeless tobacco: Never Used  Substance and Sexual Activity  . Alcohol use: No  . Drug use: No  . Sexual activity: Not Currently  Lifestyle  . Physical activity:    Days per week: Not on file    Minutes per session: Not on file  . Stress: Not on file  Relationships  . Social connections:    Talks on phone: Not on file    Gets together: Not on file    Attends religious service: Not on file    Active member of club or organization: Not on file    Attends meetings of clubs or organizations: Not on file    Relationship status: Not on file  Other Topics Concern  . Not on file  Social History Narrative  . Not on file    Vitals:   08/21/18 0910  BP: 126/70  Pulse: 100  Resp: 16  Temp: 98.9 F (37.2 C)  SpO2: 98%   Body mass index is 26.89 kg/m.  Physical Exam  Nursing note and vitals reviewed. Constitutional: She is oriented to person, place, and time. She appears well-developed and well-nourished. She does not appear ill. No distress.  HENT:  Head: Normocephalic and atraumatic.  Eyes: Conjunctivae are normal.  Cardiovascular: Normal rate and regular rhythm.  Pulses:      Dorsalis pedis pulses are 2+ on the right side and 2+ on the left side.  Respiratory: Effort normal. No respiratory distress.  Musculoskeletal:        General: No edema.     Left ankle: She exhibits decreased range of motion and swelling. She exhibits no ecchymosis, no deformity and normal pulse. Tenderness. Lateral malleolus, CF ligament and posterior TFL tenderness found. No medial malleolus, no AITFL, no head of 5th  metatarsal and no proximal fibula tenderness found.     Comments: Left ankle: Mild erythema of lateral malleolus. Tenderness with light palpation of malleolus.   Neurological: She is alert and oriented to person, place, and time. She has normal strength.  Antalgic gait.  Skin: Skin is warm. No rash noted. No cyanosis or erythema.  Psychiatric: Her mood appears anxious.  Well groomed, good eye contact.     ASSESSMENT AND PLAN:  Mr. Jerald was seen today for twisted ankle.  Diagnoses and all orders for this visit:  Ankle injuries, left, initial encounter For prevention discussed. Continue left lower extremity elevation and local ice on lateral malleolus. Topical Voltaren 4 times per day and OTC acetaminophen 500 mg 3 times daily as needed.  -     DG Ankle Complete Left; Future -     diclofenac sodium (VOLTAREN) 1 % GEL; Apply 4 g topically 4 (four) times daily. -     Elastic Bandages & Supports (ANKLE SPLINT/CANVAS SMALL) MISC; 1 Device by Does not apply route daily for 7 days. -     DG Ankle Complete Left  Sprain of left ankle, unspecified ligament, initial encounter Ankle splint recommended, prescription given. Recommend waiting ankle splint for up to a week then as needed. ROM/ABC ankle exercises. Further recommendation will be given according to imaging results.  -     diclofenac sodium (VOLTAREN) 1 % GEL; Apply 4 g topically 4 (four) times daily.    Return if symptoms worsen or fail to improve.     Wing Schoch G. Martinique, MD  [facility]. [place] office.

## 2018-08-21 NOTE — Patient Instructions (Addendum)
A few things to remember from today's visit:   Ankle injuries, left, initial encounter - Plan: DG Ankle Complete Left, Elastic Bandages & Supports (ANKLE SPLINT/CANVAS SMALL) MISC   Ankle Sprain  An ankle sprain is a stretch or tear in one of the tough tissues (ligaments) that connect the bones in your ankle. An ankle sprain can happen when the ankle rolls outward (inversion sprain) or inward (eversion sprain). What are the causes? This condition is caused by rolling or twisting the ankle. What increases the risk? You are more likely to develop this condition if you play sports. What are the signs or symptoms? Symptoms of this condition include:  Pain in your ankle.  Swelling.  Bruising. This may happen right after you sprain your ankle or 1-2 days later.  Trouble standing or walking. How is this diagnosed? This condition is diagnosed with:  A physical exam. During the exam, your doctor will press on certain parts of your foot and ankle and try to move them in certain ways.  X-ray imaging. These may be taken to see how bad the sprain is and to check for broken bones. How is this treated? This condition may be treated with:  A brace or splint. This is used to keep the ankle from moving until it heals.  An elastic bandage. This is used to support the ankle.  Crutches.  Pain medicine.  Surgery. This may be needed if the sprain is very bad.  Physical therapy. This may help to improve movement in the ankle. Follow these instructions at home: If you have a brace or a splint:  Wear the brace or splint as told by your doctor. Remove it only as told by your doctor.  Loosen the brace or splint if your toes: ? Tingle. ? Lose feeling (become numb). ? Turn cold and blue.  Keep the brace or splint clean.  If the brace or splint is not waterproof: ? Do not let it get wet. ? Cover it with a watertight covering when you take a bath or a shower. If you have an elastic bandage  (dressing):  Remove it to shower or bathe.  Try not to move your ankle much, but wiggle your toes from time to time. This helps to prevent swelling.  Adjust the dressing if it feels too tight.  Loosen the dressing if your foot: ? Loses feeling. ? Tingles. ? Becomes cold and blue. Managing pain, stiffness, and swelling   Take over-the-counter and prescription medicines only as told by doctor.  For 2-3 days, keep your ankle raised (elevated) above the level of your heart.  If told, put ice on the injured area: ? If you have a removable brace or splint, remove it as told by your doctor. ? Put ice in a plastic bag. ? Place a towel between your skin and the bag. ? Leave the ice on for 20 minutes, 2-3 times a day. General instructions  Rest your ankle.  Do not use your injured leg to support your body weight until your doctor says that you can. Use crutches as told by your doctor.  Do not use any products that contain nicotine or tobacco, such as cigarettes, e-cigarettes, and chewing tobacco. If you need help quitting, ask your doctor.  Keep all follow-up visits as told by your doctor. Contact a doctor if:  Your bruises or swelling are quickly getting worse.  Your pain does not get better after you take medicine. Get help right away if:  You cannot feel your toes or foot.  Your foot or toes look blue.  You have very bad pain that gets worse. Summary  An ankle sprain is a stretch or tear in one of the tough tissues (ligaments) that connect the bones in your ankle.  This condition is caused by rolling or twisting the ankle.  Symptoms include pain, swelling, bruising, and trouble walking.  To help with pain and swelling, put ice on the injured ankle, raise your ankle above the level of your heart, and use an elastic bandage. Also, rest as told by your doctor.  Keep all follow-up visits as told by your doctor. This is important. This information is not intended to  replace advice given to you by your health care provider. Make sure you discuss any questions you have with your health care provider. Document Released: 02/01/2008 Document Revised: 01/09/2018 Document Reviewed: 01/09/2018 Elsevier Interactive Patient Education  2019 [employer].  Please be sure medication list is accurate. If a new problem present, please set up appointment sooner than planned today.

## 2018-08-23 ENCOUNTER — Ambulatory Visit (INDEPENDENT_AMBULATORY_CARE_PROVIDER_SITE_OTHER): Payer: Self-pay | Admitting: Orthopaedic Surgery

## 2018-08-23 ENCOUNTER — Telehealth: Payer: Self-pay

## 2018-08-23 NOTE — Telephone Encounter (Signed)
Copied from [place] [id number]. Topic: General - Other >> Aug 23, 2018  8:12 AM Judyann Munson wrote: Reason for CRM: Patient daughter is calling to state the orthopedic she was advise to go to was unavailable and she wanted to know if there is somewhere different for the patient to go. The PT was seen on  08-21-18 for a  Ankle injury the patient is not in pain.  The best contact number is [phone].

## 2018-08-24 ENCOUNTER — Encounter (INDEPENDENT_AMBULATORY_CARE_PROVIDER_SITE_OTHER): Payer: Self-pay | Admitting: Orthopaedic Surgery

## 2018-08-24 ENCOUNTER — Ambulatory Visit (INDEPENDENT_AMBULATORY_CARE_PROVIDER_SITE_OTHER): Payer: Medicare Other | Admitting: Orthopaedic Surgery

## 2018-08-24 DIAGNOSIS — S8265XA Nondisplaced fracture of lateral malleolus of left fibula, initial encounter for closed fracture: Secondary | ICD-10-CM | POA: Insufficient documentation

## 2018-08-24 NOTE — Progress Notes (Signed)
Office Visit Note   Patient: Amber Leach           Date of Birth: Jan 12, 1936           MRN: [id number] Visit Date: 08/24/2018              Requested by: Martinique, Betty G, MD [address] [place], [place] [address] PCP: Martinique, Betty G, MD   Assessment & Plan: Visit Diagnoses:  1. Nondisplaced fracture of lateral malleolus of left fibula, initial encounter for closed fracture     Plan: Impression is small avulsion left lateral malleolus.  We will place the patient in a cam walker weightbearing as tolerated.  She will continue to ice and elevate for pain and swelling.  Take over-the-counter medications as needed.  Follow-up with [place] in 2 weeks time for repeat evaluation and 3 view x-rays of the left ankle.  Follow-Up Instructions: Return in about 2 weeks (around 09/07/2018).   Orders:  No orders of the defined types were placed in this encounter.  No orders of the defined types were placed in this encounter.     Procedures: No procedures performed   Clinical Data: No additional findings.   Subjective: Chief Complaint  Patient presents with  . Left Ankle - Pain, Injury    DOI 08/21/18    HPI patient is a pleasant 82 year old female presents our clinic today with a small avulsion fracture to the left lateral malleolus.  This occurred on Monday, 08/20/2018.  She was stepping off a curb when she inverted her left ankle.  She had x-rays done on 08/21/2018 which showed a small avulsion fracture left lateral malleolus.  She has been weightbearing as tolerated in a regular shoe since.  She does have pain when putting pressure on her foot.  She has been elevating, icing as well as using Tylenol and diclofenac gel with moderate relief of symptoms.  She does take 2000 units of vitamin D3 daily in addition to 1000 units in her multivitamin.  Review of Systems as detailed in HPI.  All others reviewed and are negative.   Objective: Vital Signs: There were no vitals  taken for this visit.  Physical Exam well-developed well-nourished female no acute distress.  Alert and oriented x3.  Ortho Exam examination of the left ankle reveals marked tenderness over the lateral malleolus.  Minimal swelling.  No ecchymosis.  Fair range of motion of the ankle.  Calf is soft nontender.  She is neurovascularly intact distally.  Specialty Comments:  No specialty comments available.  Imaging: No new imaging   PMFS History: Patient Active Problem List   Diagnosis Date Noted  . Nondisplaced fracture of lateral malleolus of left fibula, initial encounter for closed fracture 08/24/2018  . Dyshidrotic eczema 04/19/2017  . CKD (chronic kidney disease), stage III ([place]) 02/26/2017  . Essential hypertension 07/25/2016  . Hyperlipidemia 07/25/2016   Past Medical History:  Diagnosis Date  . Hyperlipidemia   . Hypertension     Family History  Problem Relation Age of Onset  . Heart disease Mother        CAD  . Alcohol abuse Mother   . Arthritis Mother   . Alcohol abuse Father   . Alcohol abuse Sister   . Alcohol abuse Paternal Grandmother   . Alcohol abuse Paternal Grandfather     Past Surgical History:  Procedure Laterality Date  . ABDOMINAL HYSTERECTOMY  1984  . BREAST BIOPSY  1982   Social History  Occupational History  . Occupation: Retired  Tobacco Use  . Smoking status: Former Smoker    Types: Cigarettes    Last attempt to quit: 2008    Years since quitting: 11.9  . Smokeless tobacco: Never Used  Substance and Sexual Activity  . Alcohol use: No  . Drug use: No  . Sexual activity: Not Currently

## 2018-08-27 NOTE — Telephone Encounter (Signed)
Message sent to Dr. Jordan for review. 

## 2018-08-28 NOTE — Telephone Encounter (Signed)
As I remember I did not refer her to a specific provider, I did recommend going to acute orthopedic clinic due to small fracture seen on left ankle x-ray.   She was already evaluated by Ortho, Dr. Erlinda Hong, 08/24/2017.  No further recommendations at this time.  Amber Marshman Martinique, MD

## 2018-08-29 DIAGNOSIS — D499 Neoplasm of unspecified behavior of unspecified site: Secondary | ICD-10-CM

## 2018-08-29 HISTORY — DX: Neoplasm of unspecified behavior of unspecified site: D49.9

## 2018-09-07 ENCOUNTER — Encounter (INDEPENDENT_AMBULATORY_CARE_PROVIDER_SITE_OTHER): Payer: Self-pay | Admitting: Orthopaedic Surgery

## 2018-09-07 ENCOUNTER — Ambulatory Visit (INDEPENDENT_AMBULATORY_CARE_PROVIDER_SITE_OTHER): Payer: Medicare Other

## 2018-09-07 ENCOUNTER — Ambulatory Visit (INDEPENDENT_AMBULATORY_CARE_PROVIDER_SITE_OTHER): Payer: Medicare Other | Admitting: Orthopaedic Surgery

## 2018-09-07 DIAGNOSIS — S8265XA Nondisplaced fracture of lateral malleolus of left fibula, initial encounter for closed fracture: Secondary | ICD-10-CM

## 2018-09-07 NOTE — Progress Notes (Signed)
   Office Visit Note   Patient: Amber Leach           Date of Birth: [birth year]           MRN: [id number] Visit Date: 09/07/2018              Requested by: Martinique, Betty G, MD [address] [place], [place] [address] PCP: Martinique, Betty G, MD   Assessment & Plan: Visit Diagnoses:  1. Nondisplaced fracture of lateral malleolus of left fibula, initial encounter for closed fracture     Plan: Impression is healing lateral malleolus avulsion fracture.  We will discontinue the cam walker and provide her with an ASO brace at this point.  I do not feel strongly that she needs physical therapy as she is doing quite well.  She will let us know if she feels like she needs physical therapy.  Otherwise we will see her back in a month for recheck.  If she is having problems then we may consider x-rays otherwise she does not need any.  Follow-Up Instructions: Return in about 4 weeks (around 10/05/2018).   Orders:  Orders Placed This Encounter  Procedures  . XR Ankle Complete Left   No orders of the defined types were placed in this encounter.     Procedures: No procedures performed   Clinical Data: No additional findings.   Subjective: Chief Complaint  Patient presents with  . Left Ankle - Pain    Amber Leach follows up today for her avulsion fracture of her lateral malleolus.  She is approximately 2 weeks from this initial injury.  She states that she is significantly better.  She has mild pain at times.  She is eager to remove the cam walker.   Review of Systems   Objective: Vital Signs: There were no vitals taken for this visit.  Physical Exam  Ortho Exam Left ankle exam shows no swelling or bruising.  She has minimal tenderness palpation of the lateral malleolus. Specialty Comments:  No specialty comments available.  Imaging: Xr Ankle Complete Left  Result Date: 09/07/2018 Healing avulsion fracture of the lateral malleolus    PMFS History: Patient  Active Problem List   Diagnosis Date Noted  . Nondisplaced fracture of lateral malleolus of left fibula, initial encounter for closed fracture 08/24/2018  . Dyshidrotic eczema 04/19/2017  . CKD (chronic kidney disease), stage III ([place]) 02/26/2017  . Essential hypertension 07/25/2016  . Hyperlipidemia 07/25/2016   Past Medical History:  Diagnosis Date  . Hyperlipidemia   . Hypertension     Family History  Problem Relation Age of Onset  . Heart disease Mother        CAD  . Alcohol abuse Mother   . Arthritis Mother   . Alcohol abuse Father   . Alcohol abuse Sister   . Alcohol abuse Paternal Grandmother   . Alcohol abuse Paternal Grandfather     Past Surgical History:  Procedure Laterality Date  . ABDOMINAL HYSTERECTOMY  1984  . BREAST BIOPSY  1982   Social History   Occupational History  . Occupation: Retired  Tobacco Use  . Smoking status: Former Smoker    Types: Cigarettes    Last attempt to quit: 2008    Years since quitting: 12.0  . Smokeless tobacco: Never Used  Substance and Sexual Activity  . Alcohol use: No  . Drug use: No  . Sexual activity: Not Currently

## 2018-09-10 ENCOUNTER — Encounter: Payer: Self-pay | Admitting: Family Medicine

## 2018-09-10 ENCOUNTER — Ambulatory Visit (INDEPENDENT_AMBULATORY_CARE_PROVIDER_SITE_OTHER): Payer: Medicare Other

## 2018-09-10 ENCOUNTER — Ambulatory Visit (INDEPENDENT_AMBULATORY_CARE_PROVIDER_SITE_OTHER): Payer: Medicare Other | Admitting: Family Medicine

## 2018-09-10 VITALS — BP 120/70 | HR 107 | Temp 99.0°F | Resp 16 | Ht 62.0 in | Wt 146.0 lb

## 2018-09-10 DIAGNOSIS — R221 Localized swelling, mass and lump, neck: Secondary | ICD-10-CM | POA: Diagnosis not present

## 2018-09-10 DIAGNOSIS — Z23 Encounter for immunization: Secondary | ICD-10-CM

## 2018-09-10 DIAGNOSIS — R109 Unspecified abdominal pain: Secondary | ICD-10-CM

## 2018-09-10 NOTE — Patient Instructions (Signed)
A few things to remember from today's visit:   Abdominal pain, unspecified abdominal location - Plan: US Abdomen Complete, Comprehensive metabolic panel, CBC with Differential/Platelet  Localized swelling, mass or lump of neck - Plan: CT Soft Tissue Neck W Contrast, Comprehensive metabolic panel, CBC with Differential/Platelet   Please be sure medication list is accurate. If a new problem present, please set up appointment sooner than planned today.

## 2018-09-10 NOTE — Progress Notes (Signed)
ACUTE VISIT   HPI:  Chief Complaint  Patient presents with  . Neck Pain    sudden onset of left-sided neck pain x1 day, patient denies an injury, states she thought she slept wrong    Amber Leach is a 83 y.o. female, who is here today with her daughter complaining of left-sided neck mass she noted yesterday. Problem is constant. She woke up yesterday with mild neck pain,thoguht she slept "wrong." Pain is mild,achy,exacerbated by movement and alleviated by rest. Mild limitation of ROM. No Hx of trauma.  Negative for dysphagia ,odynophagia,or oral lesions.   She is not sure for how long  Mass has been present because she has been wearing turtle neck sweaters. Her daughter has not noted any abnormality either. Negative for growth since first noted.   She has not had a mammogram in years. She has not noted breast masses,skin changes, or nipple discharge.  She denies fever, night sweats, abnormal weight loss, cough, wheezing, or dyspnea.  Occasionally she has "indigestion", denies changes in bowel habits or bloating sensation. Negative for vaginal bleeding or urinary symptoms.   Review of Systems  Constitutional: Negative for activity change, appetite change, fatigue and fever.  HENT: Negative for mouth sores, nosebleeds, sore throat and trouble swallowing.   Respiratory: Negative for cough, shortness of breath and wheezing.   Cardiovascular: Negative for chest pain, palpitations and leg swelling.  Gastrointestinal: Negative for abdominal pain, blood in stool, nausea and vomiting.       Negative for changes in bowel habits.  Genitourinary: Negative for decreased urine volume, dysuria and hematuria.  Musculoskeletal: Positive for neck pain.  Skin: Negative for pallor and rash.  Neurological: Negative for syncope, weakness and headaches.      Current Outpatient Medications on File Prior to Visit  Medication Sig Dispense Refill  . amLODipine  (NORVASC) 5 MG tablet TAKE 1 TABLET BY MOUTH ONCE DAILY 90 tablet 1  . diclofenac sodium (VOLTAREN) 1 % GEL Apply 4 g topically 4 (four) times daily. 4 Tube 0  . lisinopril (PRINIVIL,ZESTRIL) 20 MG tablet TAKE 1 TABLET BY MOUTH DAILY 90 tablet 2  . pravastatin (PRAVACHOL) 40 MG tablet TAKE 1 TABLET BY MOUTH ONCE DAILY 90 tablet 2   No current facility-administered medications on file prior to visit.      Past Medical History:  Diagnosis Date  . Hyperlipidemia   . Hypertension    Allergies  Allergen Reactions  . Sulfa Antibiotics   . Vicodin [Hydrocodone-Acetaminophen] Nausea And Vomiting    Social History   Socioeconomic History  . Marital status: Married    Spouse name: 2  . Number of children: Not on file  . Years of education: Not on file  . Highest education level: Not on file  Occupational History  . Occupation: Retired  Scientific laboratory technician  . Financial resource strain: Not on file  . Food insecurity:    Worry: Not on file    Inability: Not on file  . Transportation needs:    Medical: Not on file    Non-medical: Not on file  Tobacco Use  . Smoking status: Former Smoker    Types: Cigarettes    Last attempt to quit: 2008    Years since quitting: 12.0  . Smokeless tobacco: Never Used  Substance and Sexual Activity  . Alcohol use: No  . Drug use: No  . Sexual activity: Not Currently  Lifestyle  . Physical activity:    Days per  week: Not on file    Minutes per session: Not on file  . Stress: Not on file  Relationships  . Social connections:    Talks on phone: Not on file    Gets together: Not on file    Attends religious service: Not on file    Active member of club or organization: Not on file    Attends meetings of clubs or organizations: Not on file    Relationship status: Not on file  Other Topics Concern  . Not on file  Social History Narrative  . Not on file    Vitals:   09/10/18 1501  BP: 120/70  Pulse: (!) 107  Resp: 16  Temp: 99 F (37.2 C)     Body mass index is 26.7 kg/m.   Physical Exam  Nursing note and vitals reviewed. Constitutional: She is oriented to person, place, and time. She appears well-developed and well-nourished. No distress.  HENT:  Head: Normocephalic and atraumatic.  Mouth/Throat: Oropharynx is clear and moist and mucous membranes are normal.  Eyes: Pupils are equal, round, and reactive to light. Conjunctivae are normal.  Neck:    Left-sided mass, 5-6 cm. It is not tender,hard, no defined borders of lower aspect of mass,when going towards clavicle.   Cardiovascular: Normal rate and regular rhythm.  No murmur heard. HR 96/min by my count.  Respiratory: Effort normal and breath sounds normal. No respiratory distress.  GI: Soft. She exhibits mass (? epigastric mass, area is not soft as the rest of her abdomen.). There is no hepatomegaly. There is no abdominal tenderness.    Musculoskeletal:        General: No edema.     Cervical back: She exhibits decreased range of motion (Mild lilitation left-sided rotation). She exhibits no tenderness.  Lymphadenopathy:    She has no cervical adenopathy.  Neurological: She is alert and oriented to person, place, and time. She has normal strength. No cranial nerve deficit.  Skin: Skin is warm. No rash noted. No erythema.  Psychiatric: She has a normal mood and affect.  Well groomed, good eye contact.     ASSESSMENT AND PLAN:  Amber Leach was seen today for neck pain.  Diagnoses and all orders for this visit:  Lab Results  Component Value Date   ALT 10 09/10/2018   AST 40 (H) 09/10/2018   ALKPHOS 94 09/10/2018   BILITOT 0.4 09/10/2018   Lab Results  Component Value Date   WBC 9.6 09/10/2018   HGB 11.8 (L) 09/10/2018   HCT 36.0 09/10/2018   MCV 87.4 09/10/2018   PLT 387.0 09/10/2018   Lab Results  Component Value Date   CREATININE 1.21 (H) 09/10/2018   BUN 20 09/10/2018   NA 139 09/10/2018   K 4.2 09/10/2018   CL 103 09/10/2018   CO2 26  09/10/2018    Abdominal pain, unspecified abdominal location Mild. ? Dyspepsia. On exam epigastric area rounded area that is is not soft. Will obtain abdominal US.  -     US Abdomen Complete; Future -     Comprehensive metabolic panel -     CBC with Differential/Platelet  Localized swelling, mass or lump of neck We discussed possible causes. Characteristics do not suggest infectious process. Malignancy is a concerned. Neck CT will be arranged and further work-up will be recommended according to results.   -     CT Soft Tissue Neck W Contrast; Future -     Comprehensive metabolic panel -  CBC with Differential/Platelet -     DG Chest 2 View; Future -     DG Chest 2 View  Need for influenza vaccination -     Flu vaccine HIGH DOSE PF (Fluzone High dose)    Return if symptoms worsen or fail to improve, for depending of results.         G. Martinique, MD  [facility]. [place] office.

## 2018-09-11 LAB — CBC WITH DIFFERENTIAL/PLATELET
BASOS ABS: 0.1 10*3/uL (ref 0.0–0.1)
Basophils Relative: 1.3 % (ref 0.0–3.0)
EOS ABS: 0.1 10*3/uL (ref 0.0–0.7)
Eosinophils Relative: 1.4 % (ref 0.0–5.0)
HEMATOCRIT: 36 % (ref 36.0–46.0)
HEMOGLOBIN: 11.8 g/dL — AB (ref 12.0–15.0)
LYMPHS PCT: 20.5 % (ref 12.0–46.0)
Lymphs Abs: 2 10*3/uL (ref 0.7–4.0)
MCHC: 32.8 g/dL (ref 30.0–36.0)
MCV: 87.4 fl (ref 78.0–100.0)
Monocytes Absolute: 1.3 10*3/uL — ABNORMAL HIGH (ref 0.1–1.0)
Monocytes Relative: 13.2 % — ABNORMAL HIGH (ref 3.0–12.0)
NEUTROS ABS: 6.1 10*3/uL (ref 1.4–7.7)
Neutrophils Relative %: 63.6 % (ref 43.0–77.0)
PLATELETS: 387 10*3/uL (ref 150.0–400.0)
RBC: 4.12 Mil/uL (ref 3.87–5.11)
RDW: 15.9 % — ABNORMAL HIGH (ref 11.5–15.5)
WBC: 9.6 10*3/uL (ref 4.0–10.5)

## 2018-09-11 LAB — COMPREHENSIVE METABOLIC PANEL
ALBUMIN: 3.6 g/dL (ref 3.5–5.2)
ALK PHOS: 94 U/L (ref 39–117)
ALT: 10 U/L (ref 0–35)
AST: 40 U/L — AB (ref 0–37)
BILIRUBIN TOTAL: 0.4 mg/dL (ref 0.2–1.2)
BUN: 20 mg/dL (ref 6–23)
CALCIUM: 9.8 mg/dL (ref 8.4–10.5)
CO2: 26 mEq/L (ref 19–32)
CREATININE: 1.21 mg/dL — AB (ref 0.40–1.20)
Chloride: 103 mEq/L (ref 96–112)
GFR: 45.42 mL/min — AB (ref >60.00)
Glucose, Bld: 94 mg/dL (ref 70–99)
Potassium: 4.2 mEq/L (ref 3.5–5.1)
Sodium: 139 mEq/L (ref 135–145)
TOTAL PROTEIN: 7.3 g/dL (ref 6.0–8.3)

## 2018-09-12 ENCOUNTER — Ambulatory Visit
Admission: RE | Admit: 2018-09-12 | Discharge: 2018-09-12 | Disposition: A | Discharge status: Home / Self Care | Payer: Medicare Other | Source: Ambulatory Visit | Attending: Family Medicine | Admitting: Family Medicine

## 2018-09-12 ENCOUNTER — Other Ambulatory Visit: Payer: Self-pay | Admitting: Family Medicine

## 2018-09-12 DIAGNOSIS — R221 Localized swelling, mass and lump, neck: Secondary | ICD-10-CM

## 2018-09-12 MED ORDER — IOPAMIDOL (ISOVUE-300) INJECTION 61%
75.0000 mL | Freq: Once | INTRAVENOUS | Status: AC | PRN
  Administered 2018-09-12: 75 mL via INTRAVENOUS

## 2018-09-14 ENCOUNTER — Other Ambulatory Visit: Payer: Self-pay | Admitting: Family Medicine

## 2018-09-14 ENCOUNTER — Telehealth: Payer: Self-pay

## 2018-09-14 ENCOUNTER — Ambulatory Visit
Admission: RE | Admit: 2018-09-14 | Discharge: 2018-09-14 | Disposition: A | Discharge status: Home / Self Care | Payer: Medicare Other | Source: Ambulatory Visit | Attending: Family Medicine | Admitting: Family Medicine

## 2018-09-14 DIAGNOSIS — R109 Unspecified abdominal pain: Secondary | ICD-10-CM

## 2018-09-14 DIAGNOSIS — R935 Abnormal findings on diagnostic imaging of other abdominal regions, including retroperitoneum: Secondary | ICD-10-CM

## 2018-09-14 DIAGNOSIS — R1907 Generalized intra-abdominal and pelvic swelling, mass and lump: Secondary | ICD-10-CM

## 2018-09-14 DIAGNOSIS — R221 Localized swelling, mass and lump, neck: Secondary | ICD-10-CM

## 2018-09-14 DIAGNOSIS — R590 Localized enlarged lymph nodes: Secondary | ICD-10-CM

## 2018-09-14 DIAGNOSIS — R229 Localized swelling, mass and lump, unspecified: Secondary | ICD-10-CM

## 2018-09-14 DIAGNOSIS — N1330 Unspecified hydronephrosis: Secondary | ICD-10-CM | POA: Diagnosis not present

## 2018-09-14 DIAGNOSIS — IMO0002 Reserved for concepts with insufficient information to code with codable children: Secondary | ICD-10-CM

## 2018-09-14 NOTE — Telephone Encounter (Signed)
CALL REPORT FOR STAT U/S Amber Leach at [facility] called with STAT report for U/S: Positive for abdominal mass, recommending CT for further evaluation  Advised Dr. Martinique. She requests STAT CT Abdomen.  Order placed.   Pt aware of need for CT. She cannot get back to [place] today. She will call and schedule for Monday.  Dr. Martinique - FYI. Thanks!

## 2018-09-14 NOTE — Progress Notes (Unsigned)
.  uro

## 2018-09-17 ENCOUNTER — Ambulatory Visit: Payer: Medicare Other | Admitting: Family Medicine

## 2018-09-17 ENCOUNTER — Telehealth: Payer: Self-pay | Admitting: Family Medicine

## 2018-09-17 ENCOUNTER — Ambulatory Visit
Admission: RE | Admit: 2018-09-17 | Discharge: 2018-09-17 | Disposition: A | Discharge status: Home / Self Care | Payer: Medicare Other | Source: Ambulatory Visit | Attending: Family Medicine | Admitting: Family Medicine

## 2018-09-17 ENCOUNTER — Other Ambulatory Visit: Payer: Self-pay | Admitting: Family Medicine

## 2018-09-17 DIAGNOSIS — R1907 Generalized intra-abdominal and pelvic swelling, mass and lump: Secondary | ICD-10-CM

## 2018-09-17 DIAGNOSIS — N1330 Unspecified hydronephrosis: Secondary | ICD-10-CM | POA: Diagnosis not present

## 2018-09-17 DIAGNOSIS — R590 Localized enlarged lymph nodes: Secondary | ICD-10-CM

## 2018-09-17 DIAGNOSIS — R935 Abnormal findings on diagnostic imaging of other abdominal regions, including retroperitoneum: Secondary | ICD-10-CM

## 2018-09-17 DIAGNOSIS — J432 Centrilobular emphysema: Secondary | ICD-10-CM | POA: Diagnosis not present

## 2018-09-17 DIAGNOSIS — R221 Localized swelling, mass and lump, neck: Secondary | ICD-10-CM

## 2018-09-17 MED ORDER — IOPAMIDOL (ISOVUE-300) INJECTION 61%
75.0000 mL | Freq: Once | INTRAVENOUS | Status: AC | PRN
  Administered 2018-09-17: 75 mL via INTRAVENOUS

## 2018-09-17 NOTE — Telephone Encounter (Signed)
Message sent to Dr. Jordan for review. 

## 2018-09-17 NOTE — Telephone Encounter (Addendum)
Copied from English. Topic: General - Other >> Sep 17, 2018  7:13 AM Lennox Solders wrote: Reason for CRM: pt daughter lauren is calling her mother saw dr Martinique on 09-10-2018. Pt is still having neck pain and her shoulder and head is hurting her now. Pt daughter was offerred an appt but would like to see if dr Martinique want her to be seen or refer for biopsy per neck ct scan. Please call pt daughter  before 830 am or after 930 am . Pt has abd ct scan this afternoon. Pt is unable to come in this morning due to drinking barium for ct scan this afternoon

## 2018-09-18 ENCOUNTER — Ambulatory Visit (INDEPENDENT_AMBULATORY_CARE_PROVIDER_SITE_OTHER): Payer: Medicare Other | Admitting: Family Medicine

## 2018-09-18 ENCOUNTER — Encounter: Payer: Self-pay | Admitting: Family Medicine

## 2018-09-18 ENCOUNTER — Ambulatory Visit: Payer: Medicare Other | Admitting: Family Medicine

## 2018-09-18 VITALS — BP 134/64 | HR 76 | Temp 98.3°F | Resp 16 | Ht 62.0 in | Wt 144.0 lb

## 2018-09-18 DIAGNOSIS — N1330 Unspecified hydronephrosis: Secondary | ICD-10-CM

## 2018-09-18 DIAGNOSIS — C562 Malignant neoplasm of left ovary: Secondary | ICD-10-CM | POA: Diagnosis not present

## 2018-09-18 DIAGNOSIS — M542 Cervicalgia: Secondary | ICD-10-CM

## 2018-09-18 DIAGNOSIS — C561 Malignant neoplasm of right ovary: Secondary | ICD-10-CM | POA: Diagnosis not present

## 2018-09-18 DIAGNOSIS — C563 Malignant neoplasm of bilateral ovaries: Secondary | ICD-10-CM

## 2018-09-18 MED ORDER — TRAMADOL HCL 50 MG PO TABS: 50.0000 mg | ORAL_TABLET | Freq: Three times a day (TID) | ORAL | 0 refills | Status: DC | PRN

## 2018-09-18 NOTE — Telephone Encounter (Signed)
Pt daughter Ander Purpura [phone] calling back about message she sent yesterday morning please call daughter back at pt had CT scan yesterday and not feeling well

## 2018-09-18 NOTE — Telephone Encounter (Signed)
Lauren bringing the pt in at noon today.  Nothing further needed.

## 2018-09-18 NOTE — Progress Notes (Signed)
HPI:  Chief Complaint  Patient presents with  . Follow-up    Amber Leach is a 83 y.o. female, who is here today with her daughter to discussed recent abdomina/pelvic and chest CT. She was evaluated on 09/10/18 because new onset of lefi-sided neck mass, during OV she also mentioned abdominal pain.  Neck CT done on 09/12/18:  1. Large, round soft tissue mass at the left base of the neck tracking toward the left axilla encompasses 63 x 50 x 74 mm. No surrounding lymphadenopathy, although there is a solitary suspicious contralateral right axillary lymph node (see #2). No primary tumor of the pharynx/larynx or left lung apex.The mass is indeterminate, but should be amenable to ultrasound-guided needle biopsy. Top differential considerations include: - Desmoid tumor (AKA Aggressive Fibromatosis), - Metastasis, - Schwannoma, - Fibrosarcoma or other connective tissue primary.  2. Small but heterogeneous right axillary lymph node measuring 10-11 mm. This can be disposition following pathologic diagnosis of #1. 3. Aortic Atherosclerosis (ICD10-I70.0) and Emphysema (ICD10-J43.9). Appt for US guided Bx is being arranged.  Abdominal-pelvic CT and chest CT on 09/17/18: 1. Large heterogeneous right adnexal mass is contiguous with the right ovary and uterus/cervix. Additional associated findings include a separate left ovarian mass, peritoneal carcinomatosis,necrotic adenopathy in the abdomen and pelvis, splenic metastases and probable right pleural metastatic disease. Findings are indicative of stage IV ovarian or uterine/cervical cancer. 2. Left supraclavicular necrotic mass, better imaged on 09/12/2018 and most indicative of metastatic disease. 3. Severe right hydronephrosis due to the right adnexal mass. 4. Trace right pleural effusion. 5. Hyperdense lesions in the upper pole left kidney can not be characterized as cysts. 6. Cystocele. 7.  Aortic atherosclerosis  (ICD10-170.0).  Left-sided cervical pain around mass radiated to left arm, gradually getting worse. She is taking Ibuprofen and Acetaminophen,these combination helps some but for short period of time. "Very sore" 8/10,intermittent. She has not identified exacerbating or alleviating factors.  She has not noted fever,chills,or abnormal wt loss.    Review of Systems  Constitutional: Negative for activity change, appetite change, fatigue and fever.  HENT: Negative for mouth sores, nosebleeds and trouble swallowing.   Eyes: Negative for redness and visual disturbance.  Respiratory: Negative for cough, shortness of breath and wheezing.   Cardiovascular: Negative for chest pain, palpitations and leg swelling.  Gastrointestinal: Positive for abdominal pain (mild). Negative for nausea and vomiting.       Negative for changes in bowel habits.  Genitourinary: Negative for decreased urine volume, dysuria, flank pain and hematuria.  Musculoskeletal: Positive for neck pain.  Neurological: Negative for weakness and headaches.  Hematological: Positive for adenopathy.      Current Outpatient Medications on File Prior to Visit  Medication Sig Dispense Refill  . amLODipine (NORVASC) 5 MG tablet TAKE 1 TABLET BY MOUTH ONCE DAILY 90 tablet 1  . diclofenac sodium (VOLTAREN) 1 % GEL Apply 4 g topically 4 (four) times daily. 4 Tube 0  . lisinopril (PRINIVIL,ZESTRIL) 20 MG tablet TAKE 1 TABLET BY MOUTH DAILY 90 tablet 2  . pravastatin (PRAVACHOL) 40 MG tablet TAKE 1 TABLET BY MOUTH ONCE DAILY 90 tablet 2   No current facility-administered medications on file prior to visit.      Past Medical History:  Diagnosis Date  . Hyperlipidemia   . Hypertension    Past Surgical History:  Procedure Laterality Date  . ABDOMINAL HYSTERECTOMY  1984  . BREAST BIOPSY  1982    Allergies  Allergen Reactions  .  Sulfa Antibiotics   . Vicodin [Hydrocodone-Acetaminophen] Nausea And Vomiting    Social History     Socioeconomic History  . Marital status: Married    Spouse name: 2  . Number of children: Not on file  . Years of education: Not on file  . Highest education level: Not on file  Occupational History  . Occupation: Retired  Scientific laboratory technician  . Financial resource strain: Not on file  . Food insecurity:    Worry: Not on file    Inability: Not on file  . Transportation needs:    Medical: Not on file    Non-medical: Not on file  Tobacco Use  . Smoking status: Former Smoker    Types: Cigarettes    Last attempt to quit: 2008    Years since quitting: 12.0  . Smokeless tobacco: Never Used  Substance and Sexual Activity  . Alcohol use: No  . Drug use: No  . Sexual activity: Not Currently  Lifestyle  . Physical activity:    Days per week: Not on file    Minutes per session: Not on file  . Stress: Not on file  Relationships  . Social connections:    Talks on phone: Not on file    Gets together: Not on file    Attends religious service: Not on file    Active member of club or organization: Not on file    Attends meetings of clubs or organizations: Not on file    Relationship status: Not on file  Other Topics Concern  . Not on file  Social History Narrative  . Not on file    Vitals:   09/18/18 1227  BP: 134/64  Pulse: 76  Resp: 16  Temp: 98.3 F (36.8 C)   Body mass index is 26.34 kg/m.   Physical Exam  Nursing note and vitals reviewed. Constitutional: She is oriented to person, place, and time. She appears well-developed and well-nourished. No distress.  HENT:  Head: Normocephalic and atraumatic.  Mouth/Throat: Mucous membranes are normal.  Eyes: Conjunctivae are normal.  Cardiovascular: Normal rate and regular rhythm.  Respiratory: Effort normal. No respiratory distress.  Musculoskeletal:        General: No edema.     Cervical back: She exhibits decreased range of motion.  Lymphadenopathy:    She has cervical adenopathy.       Left cervical: Superficial  cervical adenopathy present.  Left cervical mass.  Neurological: She is alert and oriented to person, place, and time. She has normal strength.  Skin: Skin is warm.  Psychiatric: She has a normal mood and affect.  Well groomed, good eye contact.      ASSESSMENT AND PLAN:   Amber Leach was seen today for follow-up.  Diagnoses and all orders for this visit:  Malignant neoplasm of both ovaries ([place]) Several solid organs involved, advance stage most likely. Recommend consultation with gyn oncology.  -     Ambulatory referral to Gynecologic Oncology -     traMADol (ULTRAM) 50 MG tablet; Take 1 tablet (50 mg total) by mouth every 8 (eight) hours as needed.  Neck pain She is not interested in neck mass Bx, so appt to be cancel. Pain management dicussed,options and side effects. In the past she has not tolerated Hydrocodone well. Tramadol 50 mg tid as needed.  -     traMADol (ULTRAM) 50 MG tablet; Take 1 tablet (50 mg total) by mouth every 8 (eight) hours as needed.  Hydronephrosis of right kidney  Asymptomatic,she is not interested in having problem treat but I think it is important to do so and strongly recommend keeping appt. Appt with urologist is being arranged.    40 min face to face OV. > 50% was dedicated to discussion of Dx, prognosis, and treatment options. She is not interested in further work up. Initially she was reluctant to have gyn oncology consultation, finally she agreed to do so but does not want treatment. We discussed palliative vs hospice care.She will let me know after she meet with oncology.    We will meet again after consultation with gyn oncology.     [name], MD  [facility]. [place] office.

## 2018-09-19 ENCOUNTER — Telehealth: Payer: Self-pay

## 2018-09-19 ENCOUNTER — Other Ambulatory Visit: Payer: Self-pay | Admitting: Family Medicine

## 2018-09-19 DIAGNOSIS — C801 Malignant (primary) neoplasm, unspecified: Secondary | ICD-10-CM | POA: Insufficient documentation

## 2018-09-19 DIAGNOSIS — M542 Cervicalgia: Principal | ICD-10-CM

## 2018-09-19 MED ORDER — OXYCODONE-ACETAMINOPHEN 5-325 MG PO TABS: 1.0000 | ORAL_TABLET | Freq: Four times a day (QID) | ORAL | 0 refills | Status: AC | PRN

## 2018-09-19 NOTE — Telephone Encounter (Signed)
Spoke with Ander Purpura and gave instructions per Dr. Martinique. Verbalized understanding.

## 2018-09-19 NOTE — Telephone Encounter (Signed)
Stop Tramadol. Percocet 5-325 mg sent to take 1 tablet every 6 hours as needed for pain. Thanks, BJ

## 2018-09-19 NOTE — Telephone Encounter (Signed)
>>   Sep 19, 2018  4:47 PM Keene Breath wrote: Patient's daughter is calling to let the doctor know that the medication, Tramadol, is not helping the patient at all.  Daughter said that Dr. Martinique wanted to know so that she could increase the dosage or prescribe something something stronger.  Please advise as soon as possible, because patient is in so much pain.  CB# [phone]

## 2018-09-19 NOTE — Telephone Encounter (Signed)
Message sent to Dr. Jordan for review. 

## 2018-09-20 ENCOUNTER — Telehealth: Payer: Self-pay | Admitting: *Deleted

## 2018-09-20 NOTE — Telephone Encounter (Signed)
Called and spoke with the patient's daughter regarding her mothers appt. The appt is scheduled for tomorrow with Dr. Denman George

## 2018-09-21 ENCOUNTER — Telehealth: Payer: Self-pay | Admitting: Oncology

## 2018-09-21 ENCOUNTER — Encounter: Payer: Self-pay | Admitting: Gynecologic Oncology

## 2018-09-21 ENCOUNTER — Inpatient Hospital Stay: Payer: Medicare Other | Attending: Gynecologic Oncology | Admitting: Gynecologic Oncology

## 2018-09-21 ENCOUNTER — Encounter: Payer: Self-pay | Admitting: Oncology

## 2018-09-21 VITALS — BP 136/63 | HR 111 | Temp 98.6°F | Resp 20 | Ht 62.0 in | Wt 140.6 lb

## 2018-09-21 DIAGNOSIS — C801 Malignant (primary) neoplasm, unspecified: Secondary | ICD-10-CM

## 2018-09-21 DIAGNOSIS — C563 Malignant neoplasm of bilateral ovaries: Secondary | ICD-10-CM

## 2018-09-21 DIAGNOSIS — C770 Secondary and unspecified malignant neoplasm of lymph nodes of head, face and neck: Secondary | ICD-10-CM | POA: Diagnosis not present

## 2018-09-21 DIAGNOSIS — R110 Nausea: Secondary | ICD-10-CM

## 2018-09-21 DIAGNOSIS — D3912 Neoplasm of uncertain behavior of left ovary: Secondary | ICD-10-CM | POA: Insufficient documentation

## 2018-09-21 DIAGNOSIS — C562 Malignant neoplasm of left ovary: Principal | ICD-10-CM

## 2018-09-21 DIAGNOSIS — C561 Malignant neoplasm of right ovary: Secondary | ICD-10-CM

## 2018-09-21 DIAGNOSIS — D3911 Neoplasm of uncertain behavior of right ovary: Secondary | ICD-10-CM | POA: Diagnosis not present

## 2018-09-21 DIAGNOSIS — C787 Secondary malignant neoplasm of liver and intrahepatic bile duct: Secondary | ICD-10-CM | POA: Insufficient documentation

## 2018-09-21 DIAGNOSIS — C786 Secondary malignant neoplasm of retroperitoneum and peritoneum: Secondary | ICD-10-CM | POA: Insufficient documentation

## 2018-09-21 MED ORDER — ONDANSETRON 4 MG PO TBDP: 4.0000 mg | ORAL_TABLET | Freq: Three times a day (TID) | ORAL | 3 refills | Status: AC | PRN

## 2018-09-21 NOTE — Telephone Encounter (Signed)
Called [facility] and asked for palliative and hospice consults.  Spoke with Amy and she said they can do one or the other.  Advised her to make the hospice referral.  She said Dr. Daphene Jaeger will be attending and to fax office notes to [phone].  She will contact the patient today.

## 2018-09-21 NOTE — Patient Instructions (Signed)
Dr Denman George believes that this is a metastatic carcinoma (cancer) of unknown primary. It may be of ovarian origin, but this is unclear from imaging. A biopsy of the neck mass might provide the confirmation of the origin of this cancer.  An option for symptom control is radiation (short course) to the neck to prevent pressure on nerves and throat.  She does not recommend placement of kidney tube stents given that you have elected for no treatment with chemotherapy.  If you do obtain a biopsy of the neck mass and this suggests and ovarian cancer was the origin, then it would be reasonable to see the genetics counselors for genetic testing.  We will facilitate a consult with palliative care and hospice.  Dr Denman George has prescribed a nausea medicine to take every 4 hours as needed, and recommends that you start your pain medication.

## 2018-09-21 NOTE — Progress Notes (Signed)
Consult Note: Gyn-Onc  Consult was requested by Dr. Betty Martinique for the evaluation of Amber Leach 83 y.o. female  CC:  Chief Complaint  Patient presents with  . Malignant neoplasm of both ovaries [facility])    Assessment/Plan:  Amber. Amber Leach  is a 83 y.o.  year old with stage IV carcinoma of uncertain primary.  While the ovaries are clearly involved on imaging, so too are multiple other visceral organs and therefore it is unclear if the disease is primary ovarian or metastases to the ovaries.   In order to establish definitive diagnosis we would order a biopsy of either the neck mass or the omental mass.  This would be necessary before proceeding with any therapeutic interventions, or genetic testing.  The patient is very reluctant to undergo any biopsy procedures that she understands there will be some pain involved and would prefer not to undergo this.  I discussed that this is an incurable malignancy given the stage IV nature of the disease.  I discussed that palliative intent treatment with chemotherapy could be considered.  The patient feels very strongly that she does not want chemotherapy.  She took care of her husband for many years with progressive lymphoma.  She does not wish to prolong her life with this disease.  I discussed that a course of palliative radiation to the neck might help resolve some of her nerve and esophageal symptoms.  I explained that this is extremely well-tolerated and would likely relieve more symptoms and it creates.  The patient is once again reluctant to consider any therapeutic interventions.  However she was put speak with her daughter and can discuss this more and consider if she would like for [place] to schedule a consultation with radiation oncology.  Prior to such a procedure consultation she would need histologic confirmation of the malignancy with a biopsy.  She is already been prescribed Percocet for analgesia.  I prescribed  ODT Zofran today to help with her nausea.  We will have her consult with palliative care and have hospice consulted to implement at home care strategies.   HPI: Amber Leach is a 83 year old who is seen in consultation at the request of Dr Betty Martinique for apparent stage for malignancy.   The patient had been asymptomatic until approximately 3 weeks prior to presentation when she began experiencing some intermittent emesis, early satiety, and pressure in her neck.  More recently she had started to experience left shoulder and neck pain.  This prompted her to be seen by her primary care physician who ordered a CT scan of the chest abdomen and pelvis.  This was performed on September 17, 2018.  On CT widespread metastatic disease was identified.  This included a large heterogeneous right adnexal mass contiguous with the right ovary, uterus and cervix.  There are additional associated findings including a separate left ovarian mass, peritoneal carcinomatosis, and necrotic adenopathy in the abdomen and pelvis.  There was splenic and hepatic metastatic disease.  There is also probable right pleural metastatic disease.  A 6.9 cm left supraclavicular mass was identified which appeared consistent with metastasis.  There was bilateral hydronephrosis, with severe right hydronephrosis and some cortical loss in the right kidney.  There is also enlarged gastrohepatic node.   The patient is otherwise very healthy.  She is only had basal cell carcinomas removed from her face but no other history of malignancy.  She has a history of 2 prior vaginal deliveries.  She  has a remote history of a hysterectomy for benign fibroid disease as a younger woman.  There is no remarkable family history for malignancy.  Current Meds:  Outpatient Encounter Medications as of 09/21/2018  Medication Sig  . amLODipine (NORVASC) 5 MG tablet TAKE 1 TABLET BY MOUTH ONCE DAILY  . lisinopril (PRINIVIL,ZESTRIL) 20 MG tablet TAKE 1 TABLET BY  MOUTH DAILY  . pravastatin (PRAVACHOL) 40 MG tablet TAKE 1 TABLET BY MOUTH ONCE DAILY  . ondansetron (ZOFRAN ODT) 4 MG disintegrating tablet Take 1 tablet (4 mg total) by mouth every 8 (eight) hours as needed for nausea or vomiting.  Marland Kitchen oxyCODONE-acetaminophen (PERCOCET/ROXICET) 5-325 MG tablet Take 1 tablet by mouth every 6 (six) hours as needed for severe pain. (Patient not taking: Reported on 09/21/2018)  . [DISCONTINUED] diclofenac sodium (VOLTAREN) 1 % GEL Apply 4 g topically 4 (four) times daily. (Patient not taking: Reported on 09/21/2018)   No facility-administered encounter medications on file as of 09/21/2018.     Allergy:  Allergies  Allergen Reactions  . Sulfa Antibiotics     Patient is not aware what type of reaction, her mother told her she was allergic when she was younger   . Vicodin [Hydrocodone-Acetaminophen] Nausea And Vomiting    Social Hx:   Social History   Socioeconomic History  . Marital status: Married    Spouse name: 2  . Number of children: Not on file  . Years of education: Not on file  . Highest education level: Not on file  Occupational History  . Occupation: Retired  Scientific laboratory technician  . Financial resource strain: Not on file  . Food insecurity:    Worry: Not on file    Inability: Not on file  . Transportation needs:    Medical: Not on file    Non-medical: Not on file  Tobacco Use  . Smoking status: Former Smoker    Types: Cigarettes    Last attempt to quit: 2008    Years since quitting: 12.0  . Smokeless tobacco: Never Used  Substance and Sexual Activity  . Alcohol use: No  . Drug use: No  . Sexual activity: Not Currently  Lifestyle  . Physical activity:    Days per week: Not on file    Minutes per session: Not on file  . Stress: Not on file  Relationships  . Social connections:    Talks on phone: Not on file    Gets together: Not on file    Attends religious service: Not on file    Active member of club or organization: Not on file     Attends meetings of clubs or organizations: Not on file    Relationship status: Not on file  . Intimate partner violence:    Fear of current or ex partner: Not on file    Emotionally abused: Not on file    Physically abused: Not on file    Forced sexual activity: Not on file  Other Topics Concern  . Not on file  Social History Narrative  . Not on file    Past Surgical Hx:  Past Surgical History:  Procedure Laterality Date  . ABDOMINAL HYSTERECTOMY  1984  . BREAST BIOPSY  1982  . SKIN SURGERY     2019    Past Medical Hx:  Past Medical History:  Diagnosis Date  . C. difficile colitis 2017  . Hyperlipidemia   . Hypertension   . Tumor 08/2018   Tumor on her neck  Past Gynecological History:   No LMP recorded. Patient has had a hysterectomy.  Family Hx:  Family History  Problem Relation Age of Onset  . Heart disease Mother        CAD  . Alcohol abuse Mother   . Arthritis Mother   . Alcohol abuse Father   . Cirrhosis Father   . Alcohol abuse Sister   . Cirrhosis Sister   . Alcohol abuse Paternal Grandmother   . Alcohol abuse Paternal Grandfather     Review of Systems:  Constitutional  Feels fatigued    ENT Throat discomfort Skin/Breast  No rash, sores, jaundice, itching, dryness Cardiovascular  No chest pain, shortness of breath, or edema  Pulmonary  No cough or wheeze.  Gastro Intestinal  + nausea, + early satiety Genito Urinary  No frequency, urgency, dysuria, no bleeding or discharge Musculo Skeletal  No myalgia, arthralgia, joint swelling or pain  Neurologic  + left shoulder and neck pain Psychology  No depression, anxiety, insomnia.   Vitals:  Blood pressure 136/63, pulse (!) 111, temperature 98.6 F (37 C), temperature source Oral, resp. rate 20, height 5\' 2"  (1.575 m), weight 140 lb 9.6 oz (63.8 kg), SpO2 98 %.  Physical Exam: WD in NAD Neck  Supple NROM, without any enlargements.  Lymph Node Survey + 7cm soft, immobile left  supraclavicular mass slightly displaces trachea to right. Cardiovascular  Pulse normal rate, regularity and rhythm. S1 and S2 normal.  Lungs  Clear to auscultation bilateraly, without wheezes/crackles/rhonchi. Good air movement.  Skin  No rash/lesions/breakdown  Psychiatry  Alert and oriented to person, place, and time  Abdomen  Normoactive bowel sounds, abdomen soft, non-tender and obese without evidence of hernia.  Back No CVA tenderness Genito Urinary  deferred Rectal  deferred Extremities  No bilateral cyanosis, clubbing or edema.   Thereasa Solo, MD  09/21/2018, 4:55 PM

## 2018-09-21 NOTE — Telephone Encounter (Signed)
[facility] and canceled appointment for Monday per patient's request.

## 2018-09-24 ENCOUNTER — Telehealth: Payer: Self-pay | Admitting: Oncology

## 2018-09-24 NOTE — Telephone Encounter (Signed)
Patient's daughter, Ander Purpura, called and asked if she should be taking the Zofran every 4 hours or 8 hours.  Advised her to start with 8 hours due to risks of constipation per Joylene John, NP.  She also asked if sedation would be given if she had the needle biopsy.  Advised her that the area would be numbed but she would have sedation.  Also asked if hospice had contacted them yet.  Lauren said they have not.  Advised them to call if they haven't heard from them today.

## 2018-09-25 DIAGNOSIS — G893 Neoplasm related pain (acute) (chronic): Secondary | ICD-10-CM | POA: Diagnosis not present

## 2018-09-25 DIAGNOSIS — E559 Vitamin D deficiency, unspecified: Secondary | ICD-10-CM | POA: Diagnosis not present

## 2018-09-25 DIAGNOSIS — D4959 Neoplasm of unspecified behavior of other genitourinary organ: Secondary | ICD-10-CM | POA: Diagnosis not present

## 2018-09-25 DIAGNOSIS — I10 Essential (primary) hypertension: Secondary | ICD-10-CM | POA: Diagnosis not present

## 2018-09-25 DIAGNOSIS — J449 Chronic obstructive pulmonary disease, unspecified: Secondary | ICD-10-CM | POA: Diagnosis not present

## 2018-09-25 DIAGNOSIS — I7091 Generalized atherosclerosis: Secondary | ICD-10-CM | POA: Diagnosis not present

## 2018-09-25 DIAGNOSIS — E785 Hyperlipidemia, unspecified: Secondary | ICD-10-CM | POA: Diagnosis not present

## 2018-09-25 DIAGNOSIS — C770 Secondary and unspecified malignant neoplasm of lymph nodes of head, face and neck: Secondary | ICD-10-CM | POA: Diagnosis not present

## 2018-09-25 DIAGNOSIS — C801 Malignant (primary) neoplasm, unspecified: Secondary | ICD-10-CM | POA: Diagnosis not present

## 2018-09-25 DIAGNOSIS — C7989 Secondary malignant neoplasm of other specified sites: Secondary | ICD-10-CM | POA: Diagnosis not present

## 2018-09-26 DIAGNOSIS — G893 Neoplasm related pain (acute) (chronic): Secondary | ICD-10-CM | POA: Diagnosis not present

## 2018-09-26 DIAGNOSIS — D4959 Neoplasm of unspecified behavior of other genitourinary organ: Secondary | ICD-10-CM | POA: Diagnosis not present

## 2018-09-26 DIAGNOSIS — J449 Chronic obstructive pulmonary disease, unspecified: Secondary | ICD-10-CM | POA: Diagnosis not present

## 2018-09-26 DIAGNOSIS — C801 Malignant (primary) neoplasm, unspecified: Secondary | ICD-10-CM | POA: Diagnosis not present

## 2018-09-26 DIAGNOSIS — C770 Secondary and unspecified malignant neoplasm of lymph nodes of head, face and neck: Secondary | ICD-10-CM | POA: Diagnosis not present

## 2018-09-26 DIAGNOSIS — C7989 Secondary malignant neoplasm of other specified sites: Secondary | ICD-10-CM | POA: Diagnosis not present

## 2018-09-27 DIAGNOSIS — C801 Malignant (primary) neoplasm, unspecified: Secondary | ICD-10-CM | POA: Diagnosis not present

## 2018-09-27 DIAGNOSIS — C569 Malignant neoplasm of unspecified ovary: Secondary | ICD-10-CM | POA: Diagnosis not present

## 2018-09-27 DIAGNOSIS — G893 Neoplasm related pain (acute) (chronic): Secondary | ICD-10-CM | POA: Diagnosis not present

## 2018-09-27 DIAGNOSIS — K5900 Constipation, unspecified: Secondary | ICD-10-CM | POA: Diagnosis not present

## 2018-09-27 DIAGNOSIS — C770 Secondary and unspecified malignant neoplasm of lymph nodes of head, face and neck: Secondary | ICD-10-CM | POA: Diagnosis not present

## 2018-09-27 DIAGNOSIS — D4959 Neoplasm of unspecified behavior of other genitourinary organ: Secondary | ICD-10-CM | POA: Diagnosis not present

## 2018-09-27 DIAGNOSIS — N183 Chronic kidney disease, stage 3 (moderate): Secondary | ICD-10-CM | POA: Diagnosis not present

## 2018-09-27 DIAGNOSIS — G8929 Other chronic pain: Secondary | ICD-10-CM | POA: Diagnosis not present

## 2018-09-27 DIAGNOSIS — C7989 Secondary malignant neoplasm of other specified sites: Secondary | ICD-10-CM | POA: Diagnosis not present

## 2018-09-27 DIAGNOSIS — J449 Chronic obstructive pulmonary disease, unspecified: Secondary | ICD-10-CM | POA: Diagnosis not present

## 2018-09-28 ENCOUNTER — Telehealth: Payer: Self-pay | Admitting: Family Medicine

## 2018-09-28 NOTE — Telephone Encounter (Addendum)
Health with [facility] ([facility] calling about neck mass referral - they received a referral on 09/13/2018 for U/S Guided Bx of Neck mass and are trying to get this scheduled. Nira Conn is wanting to know if since the patient was recently diagnosed with possible bilateral ovarian cancer and has been referred to Hospice if the Bx was even still necessary at this time.   Please advise

## 2018-09-29 DIAGNOSIS — C770 Secondary and unspecified malignant neoplasm of lymph nodes of head, face and neck: Secondary | ICD-10-CM | POA: Diagnosis not present

## 2018-09-29 DIAGNOSIS — J449 Chronic obstructive pulmonary disease, unspecified: Secondary | ICD-10-CM | POA: Diagnosis not present

## 2018-09-29 DIAGNOSIS — C7989 Secondary malignant neoplasm of other specified sites: Secondary | ICD-10-CM | POA: Diagnosis not present

## 2018-09-29 DIAGNOSIS — I7091 Generalized atherosclerosis: Secondary | ICD-10-CM | POA: Diagnosis not present

## 2018-09-29 DIAGNOSIS — G893 Neoplasm related pain (acute) (chronic): Secondary | ICD-10-CM | POA: Diagnosis not present

## 2018-09-29 DIAGNOSIS — E785 Hyperlipidemia, unspecified: Secondary | ICD-10-CM | POA: Diagnosis not present

## 2018-09-29 DIAGNOSIS — D4959 Neoplasm of unspecified behavior of other genitourinary organ: Secondary | ICD-10-CM | POA: Diagnosis not present

## 2018-09-29 DIAGNOSIS — C801 Malignant (primary) neoplasm, unspecified: Secondary | ICD-10-CM | POA: Diagnosis not present

## 2018-09-29 DIAGNOSIS — E559 Vitamin D deficiency, unspecified: Secondary | ICD-10-CM | POA: Diagnosis not present

## 2018-09-29 DIAGNOSIS — I10 Essential (primary) hypertension: Secondary | ICD-10-CM | POA: Diagnosis not present

## 2018-10-01 DIAGNOSIS — G893 Neoplasm related pain (acute) (chronic): Secondary | ICD-10-CM | POA: Diagnosis not present

## 2018-10-01 DIAGNOSIS — C770 Secondary and unspecified malignant neoplasm of lymph nodes of head, face and neck: Secondary | ICD-10-CM | POA: Diagnosis not present

## 2018-10-01 DIAGNOSIS — D4959 Neoplasm of unspecified behavior of other genitourinary organ: Secondary | ICD-10-CM | POA: Diagnosis not present

## 2018-10-01 DIAGNOSIS — C801 Malignant (primary) neoplasm, unspecified: Secondary | ICD-10-CM | POA: Diagnosis not present

## 2018-10-01 DIAGNOSIS — C7989 Secondary malignant neoplasm of other specified sites: Secondary | ICD-10-CM | POA: Diagnosis not present

## 2018-10-01 DIAGNOSIS — J449 Chronic obstructive pulmonary disease, unspecified: Secondary | ICD-10-CM | POA: Diagnosis not present

## 2018-10-01 DIAGNOSIS — N183 Chronic kidney disease, stage 3 (moderate): Secondary | ICD-10-CM | POA: Diagnosis not present

## 2018-10-01 DIAGNOSIS — I10 Essential (primary) hypertension: Secondary | ICD-10-CM | POA: Diagnosis not present

## 2018-10-02 NOTE — Telephone Encounter (Signed)
Message sent to Dr. Jordan for review. Please advise 

## 2018-10-02 NOTE — Telephone Encounter (Signed)
We had this discussion during her last appointment and we decided not to pursue biopsy given the suspicious of ovarian cancer with metastases.  Appt was supposed to be cancelled. Thanks, BJ

## 2018-10-02 NOTE — Telephone Encounter (Signed)
Left message to return call to clinic. 

## 2018-10-03 NOTE — Telephone Encounter (Signed)
Left detailed message for Amber Leach informing that Bx can be canceled. Advised to call office for any questions. Nothing further needed at this time.

## 2018-10-04 DIAGNOSIS — C7989 Secondary malignant neoplasm of other specified sites: Secondary | ICD-10-CM | POA: Diagnosis not present

## 2018-10-04 DIAGNOSIS — J449 Chronic obstructive pulmonary disease, unspecified: Secondary | ICD-10-CM | POA: Diagnosis not present

## 2018-10-04 DIAGNOSIS — G893 Neoplasm related pain (acute) (chronic): Secondary | ICD-10-CM | POA: Diagnosis not present

## 2018-10-04 DIAGNOSIS — C770 Secondary and unspecified malignant neoplasm of lymph nodes of head, face and neck: Secondary | ICD-10-CM | POA: Diagnosis not present

## 2018-10-04 DIAGNOSIS — D4959 Neoplasm of unspecified behavior of other genitourinary organ: Secondary | ICD-10-CM | POA: Diagnosis not present

## 2018-10-04 DIAGNOSIS — C801 Malignant (primary) neoplasm, unspecified: Secondary | ICD-10-CM | POA: Diagnosis not present

## 2018-10-05 ENCOUNTER — Ambulatory Visit (INDEPENDENT_AMBULATORY_CARE_PROVIDER_SITE_OTHER): Payer: Medicare Other | Admitting: Orthopaedic Surgery

## 2018-10-05 ENCOUNTER — Encounter (INDEPENDENT_AMBULATORY_CARE_PROVIDER_SITE_OTHER): Payer: Self-pay

## 2018-10-09 DIAGNOSIS — G893 Neoplasm related pain (acute) (chronic): Secondary | ICD-10-CM | POA: Diagnosis not present

## 2018-10-09 DIAGNOSIS — D4959 Neoplasm of unspecified behavior of other genitourinary organ: Secondary | ICD-10-CM | POA: Diagnosis not present

## 2018-10-09 DIAGNOSIS — C7989 Secondary malignant neoplasm of other specified sites: Secondary | ICD-10-CM | POA: Diagnosis not present

## 2018-10-09 DIAGNOSIS — J449 Chronic obstructive pulmonary disease, unspecified: Secondary | ICD-10-CM | POA: Diagnosis not present

## 2018-10-09 DIAGNOSIS — C801 Malignant (primary) neoplasm, unspecified: Secondary | ICD-10-CM | POA: Diagnosis not present

## 2018-10-09 DIAGNOSIS — C770 Secondary and unspecified malignant neoplasm of lymph nodes of head, face and neck: Secondary | ICD-10-CM | POA: Diagnosis not present

## 2018-10-10 ENCOUNTER — Telehealth: Payer: Self-pay | Admitting: Family Medicine

## 2018-10-10 NOTE — Telephone Encounter (Signed)
Copied from [place] [phone]. Topic: Quick Communication - See Telephone Encounter >> Oct 10, 2018 11:15 AM Berneta Levins wrote: CRM for notification. See Telephone encounter for: 10/10/18.  Renee from [facility] calling.  Pt is on Hospice now and would like to do a pill reduction.  They need to know if it would be okay with Dr. Martinique if pt came off of: lisinopril (PRINIVIL,ZESTRIL) 20 MG tablet amLODipine (NORVASC) 5 MG tablet - pressure has been 120s to 130s over 80 pravastatin (PRAVACHOL) 40 MG tablet  Renee can be reached at [phone], [place] to leave a message.

## 2018-10-10 NOTE — Telephone Encounter (Signed)
She can discontinue pravastatin. I will be cautious with stopping both antihypertensive medications, II would like to stop one at the time. Recommend discontinuing lisinopril 20 mg. Continue monitoring BP and if readings are under 150/90, amlodipine can be discontinued.  Thanks, BJ

## 2018-10-10 NOTE — Telephone Encounter (Signed)
Message sent to Dr. Jordan for review and approval. 

## 2018-10-10 NOTE — Telephone Encounter (Signed)
Spoke with Renee and gave directions per Dr. Martinique. Renee verbalized understanding.

## 2018-10-18 DIAGNOSIS — C801 Malignant (primary) neoplasm, unspecified: Secondary | ICD-10-CM | POA: Diagnosis not present

## 2018-10-18 DIAGNOSIS — C770 Secondary and unspecified malignant neoplasm of lymph nodes of head, face and neck: Secondary | ICD-10-CM | POA: Diagnosis not present

## 2018-10-18 DIAGNOSIS — J449 Chronic obstructive pulmonary disease, unspecified: Secondary | ICD-10-CM | POA: Diagnosis not present

## 2018-10-18 DIAGNOSIS — C7989 Secondary malignant neoplasm of other specified sites: Secondary | ICD-10-CM | POA: Diagnosis not present

## 2018-10-18 DIAGNOSIS — D4959 Neoplasm of unspecified behavior of other genitourinary organ: Secondary | ICD-10-CM | POA: Diagnosis not present

## 2018-10-18 DIAGNOSIS — G893 Neoplasm related pain (acute) (chronic): Secondary | ICD-10-CM | POA: Diagnosis not present

## 2018-10-18 NOTE — Telephone Encounter (Signed)
Call to [facility])  : Blood Pressure recorded this week: 140/70, 130/70, today- 130/80  Highest BP this week- 145/78. Renee wants to get PCP input before stopping last BP medication- she does report patient is not hardly eating and was in the bed for the first time today.  Let Renee know we would send information for PCP review and recommendation

## 2018-10-18 NOTE — Telephone Encounter (Signed)
Renee with Hospice calling requesting a call back from nurse to leave/ discuss BP readings with.

## 2018-10-19 NOTE — Telephone Encounter (Signed)
Message sent to Dr. Jordan for review. 

## 2018-10-19 NOTE — Telephone Encounter (Signed)
BP seems to be stable. She can go ahead and discontinue amlodipine 5 mg. Continue monitoring BP. Thanks, BJ

## 2018-10-19 NOTE — Telephone Encounter (Signed)
Spoke with Renee and gave instructions per Dr. Martinique. Renee verbalized understanding.

## 2018-10-22 DIAGNOSIS — D4959 Neoplasm of unspecified behavior of other genitourinary organ: Secondary | ICD-10-CM | POA: Diagnosis not present

## 2018-10-22 DIAGNOSIS — C770 Secondary and unspecified malignant neoplasm of lymph nodes of head, face and neck: Secondary | ICD-10-CM | POA: Diagnosis not present

## 2018-10-22 DIAGNOSIS — G893 Neoplasm related pain (acute) (chronic): Secondary | ICD-10-CM | POA: Diagnosis not present

## 2018-10-22 DIAGNOSIS — J449 Chronic obstructive pulmonary disease, unspecified: Secondary | ICD-10-CM | POA: Diagnosis not present

## 2018-10-22 DIAGNOSIS — C7989 Secondary malignant neoplasm of other specified sites: Secondary | ICD-10-CM | POA: Diagnosis not present

## 2018-10-22 DIAGNOSIS — C801 Malignant (primary) neoplasm, unspecified: Secondary | ICD-10-CM | POA: Diagnosis not present

## 2018-10-23 DIAGNOSIS — C801 Malignant (primary) neoplasm, unspecified: Secondary | ICD-10-CM | POA: Diagnosis not present

## 2018-10-23 DIAGNOSIS — J449 Chronic obstructive pulmonary disease, unspecified: Secondary | ICD-10-CM | POA: Diagnosis not present

## 2018-10-23 DIAGNOSIS — C770 Secondary and unspecified malignant neoplasm of lymph nodes of head, face and neck: Secondary | ICD-10-CM | POA: Diagnosis not present

## 2018-10-23 DIAGNOSIS — C7989 Secondary malignant neoplasm of other specified sites: Secondary | ICD-10-CM | POA: Diagnosis not present

## 2018-10-23 DIAGNOSIS — D4959 Neoplasm of unspecified behavior of other genitourinary organ: Secondary | ICD-10-CM | POA: Diagnosis not present

## 2018-10-23 DIAGNOSIS — G893 Neoplasm related pain (acute) (chronic): Secondary | ICD-10-CM | POA: Diagnosis not present

## 2018-10-24 DIAGNOSIS — G893 Neoplasm related pain (acute) (chronic): Secondary | ICD-10-CM | POA: Diagnosis not present

## 2018-10-24 DIAGNOSIS — J449 Chronic obstructive pulmonary disease, unspecified: Secondary | ICD-10-CM | POA: Diagnosis not present

## 2018-10-24 DIAGNOSIS — D4959 Neoplasm of unspecified behavior of other genitourinary organ: Secondary | ICD-10-CM | POA: Diagnosis not present

## 2018-10-24 DIAGNOSIS — C801 Malignant (primary) neoplasm, unspecified: Secondary | ICD-10-CM | POA: Diagnosis not present

## 2018-10-24 DIAGNOSIS — C7989 Secondary malignant neoplasm of other specified sites: Secondary | ICD-10-CM | POA: Diagnosis not present

## 2018-10-24 DIAGNOSIS — C770 Secondary and unspecified malignant neoplasm of lymph nodes of head, face and neck: Secondary | ICD-10-CM | POA: Diagnosis not present

## 2018-10-25 DIAGNOSIS — J449 Chronic obstructive pulmonary disease, unspecified: Secondary | ICD-10-CM | POA: Diagnosis not present

## 2018-10-25 DIAGNOSIS — C801 Malignant (primary) neoplasm, unspecified: Secondary | ICD-10-CM | POA: Diagnosis not present

## 2018-10-25 DIAGNOSIS — D4959 Neoplasm of unspecified behavior of other genitourinary organ: Secondary | ICD-10-CM | POA: Diagnosis not present

## 2018-10-25 DIAGNOSIS — C770 Secondary and unspecified malignant neoplasm of lymph nodes of head, face and neck: Secondary | ICD-10-CM | POA: Diagnosis not present

## 2018-10-25 DIAGNOSIS — G893 Neoplasm related pain (acute) (chronic): Secondary | ICD-10-CM | POA: Diagnosis not present

## 2018-10-25 DIAGNOSIS — C7989 Secondary malignant neoplasm of other specified sites: Secondary | ICD-10-CM | POA: Diagnosis not present

## 2018-10-26 DIAGNOSIS — C770 Secondary and unspecified malignant neoplasm of lymph nodes of head, face and neck: Secondary | ICD-10-CM | POA: Diagnosis not present

## 2018-10-26 DIAGNOSIS — J449 Chronic obstructive pulmonary disease, unspecified: Secondary | ICD-10-CM | POA: Diagnosis not present

## 2018-10-26 DIAGNOSIS — C7989 Secondary malignant neoplasm of other specified sites: Secondary | ICD-10-CM | POA: Diagnosis not present

## 2018-10-26 DIAGNOSIS — C801 Malignant (primary) neoplasm, unspecified: Secondary | ICD-10-CM | POA: Diagnosis not present

## 2018-10-26 DIAGNOSIS — D4959 Neoplasm of unspecified behavior of other genitourinary organ: Secondary | ICD-10-CM | POA: Diagnosis not present

## 2018-10-26 DIAGNOSIS — G893 Neoplasm related pain (acute) (chronic): Secondary | ICD-10-CM | POA: Diagnosis not present

## 2018-10-27 DIAGNOSIS — C770 Secondary and unspecified malignant neoplasm of lymph nodes of head, face and neck: Secondary | ICD-10-CM | POA: Diagnosis not present

## 2018-10-27 DIAGNOSIS — D4959 Neoplasm of unspecified behavior of other genitourinary organ: Secondary | ICD-10-CM | POA: Diagnosis not present

## 2018-10-27 DIAGNOSIS — C7989 Secondary malignant neoplasm of other specified sites: Secondary | ICD-10-CM | POA: Diagnosis not present

## 2018-10-27 DIAGNOSIS — C801 Malignant (primary) neoplasm, unspecified: Secondary | ICD-10-CM | POA: Diagnosis not present

## 2018-10-27 DIAGNOSIS — G893 Neoplasm related pain (acute) (chronic): Secondary | ICD-10-CM | POA: Diagnosis not present

## 2018-10-27 DIAGNOSIS — J449 Chronic obstructive pulmonary disease, unspecified: Secondary | ICD-10-CM | POA: Diagnosis not present

## 2018-10-28 DIAGNOSIS — C801 Malignant (primary) neoplasm, unspecified: Secondary | ICD-10-CM | POA: Diagnosis not present

## 2018-10-28 DIAGNOSIS — G893 Neoplasm related pain (acute) (chronic): Secondary | ICD-10-CM | POA: Diagnosis not present

## 2018-10-28 DIAGNOSIS — C770 Secondary and unspecified malignant neoplasm of lymph nodes of head, face and neck: Secondary | ICD-10-CM | POA: Diagnosis not present

## 2018-10-28 DIAGNOSIS — E785 Hyperlipidemia, unspecified: Secondary | ICD-10-CM | POA: Diagnosis not present

## 2018-10-28 DIAGNOSIS — E559 Vitamin D deficiency, unspecified: Secondary | ICD-10-CM | POA: Diagnosis not present

## 2018-10-28 DIAGNOSIS — J449 Chronic obstructive pulmonary disease, unspecified: Secondary | ICD-10-CM | POA: Diagnosis not present

## 2018-10-28 DIAGNOSIS — I7091 Generalized atherosclerosis: Secondary | ICD-10-CM | POA: Diagnosis not present

## 2018-10-28 DIAGNOSIS — D4959 Neoplasm of unspecified behavior of other genitourinary organ: Secondary | ICD-10-CM | POA: Diagnosis not present

## 2018-10-28 DIAGNOSIS — I10 Essential (primary) hypertension: Secondary | ICD-10-CM | POA: Diagnosis not present

## 2018-10-28 DIAGNOSIS — C7989 Secondary malignant neoplasm of other specified sites: Secondary | ICD-10-CM | POA: Diagnosis not present

## 2018-10-29 DIAGNOSIS — C801 Malignant (primary) neoplasm, unspecified: Secondary | ICD-10-CM | POA: Diagnosis not present

## 2018-10-29 DIAGNOSIS — G893 Neoplasm related pain (acute) (chronic): Secondary | ICD-10-CM | POA: Diagnosis not present

## 2018-10-29 DIAGNOSIS — C7989 Secondary malignant neoplasm of other specified sites: Secondary | ICD-10-CM | POA: Diagnosis not present

## 2018-10-29 DIAGNOSIS — D4959 Neoplasm of unspecified behavior of other genitourinary organ: Secondary | ICD-10-CM | POA: Diagnosis not present

## 2018-10-29 DIAGNOSIS — J449 Chronic obstructive pulmonary disease, unspecified: Secondary | ICD-10-CM | POA: Diagnosis not present

## 2018-10-29 DIAGNOSIS — C770 Secondary and unspecified malignant neoplasm of lymph nodes of head, face and neck: Secondary | ICD-10-CM | POA: Diagnosis not present

## 2018-10-30 DIAGNOSIS — J449 Chronic obstructive pulmonary disease, unspecified: Secondary | ICD-10-CM | POA: Diagnosis not present

## 2018-10-30 DIAGNOSIS — C801 Malignant (primary) neoplasm, unspecified: Secondary | ICD-10-CM | POA: Diagnosis not present

## 2018-10-30 DIAGNOSIS — C770 Secondary and unspecified malignant neoplasm of lymph nodes of head, face and neck: Secondary | ICD-10-CM | POA: Diagnosis not present

## 2018-10-30 DIAGNOSIS — G893 Neoplasm related pain (acute) (chronic): Secondary | ICD-10-CM | POA: Diagnosis not present

## 2018-10-30 DIAGNOSIS — D4959 Neoplasm of unspecified behavior of other genitourinary organ: Secondary | ICD-10-CM | POA: Diagnosis not present

## 2018-10-30 DIAGNOSIS — C7989 Secondary malignant neoplasm of other specified sites: Secondary | ICD-10-CM | POA: Diagnosis not present

## 2018-11-28 DEATH — deceased

## 2018-12-12 ENCOUNTER — Ambulatory Visit: Payer: Medicare Other | Admitting: Family Medicine

## 2019-10-25 IMAGING — CT CT CHEST W/ CM
3 of 4 series · 14 of 32 positions shown, 17 images · IV contrast (APPLIED)
Comparison: CT neck 09/12/2018.

CLINICAL DATA: Question mass in the abdomen.  Abdominal swelling.

EXAM:
CT CHEST, ABDOMEN, AND PELVIS WITH CONTRAST
TECHNIQUE: Multidetector CT imaging of the chest, abdomen and pelvis was
performed following the standard protocol during bolus
administration of intravenous contrast.
CONTRAST:  75mL CAE93X-2LL IOPAMIDOL (CAE93X-2LL) INJECTION 61%

[Series 2: chest/abd/pelvis w/cm · axial · 0.81mm/px · z∈[-536,-61]mm · 8 of 119 slices shown]
[im 12/119  soft-tissue]
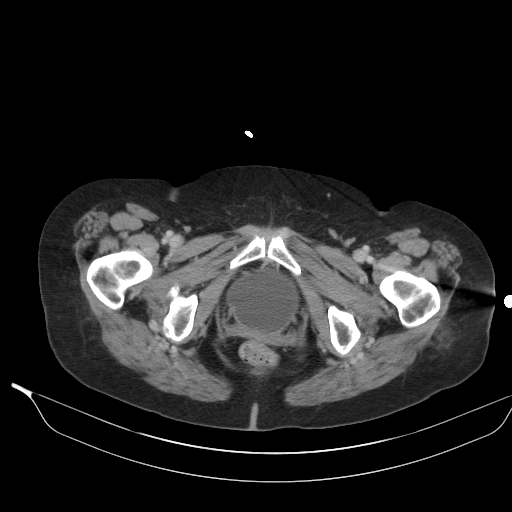
[im 24/119  soft-tissue]
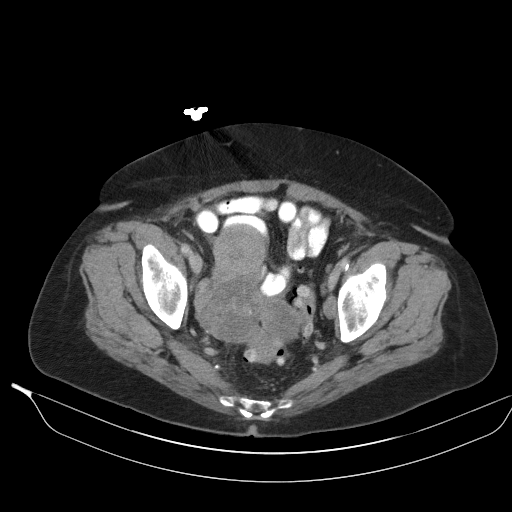
[im 36/119  soft-tissue]
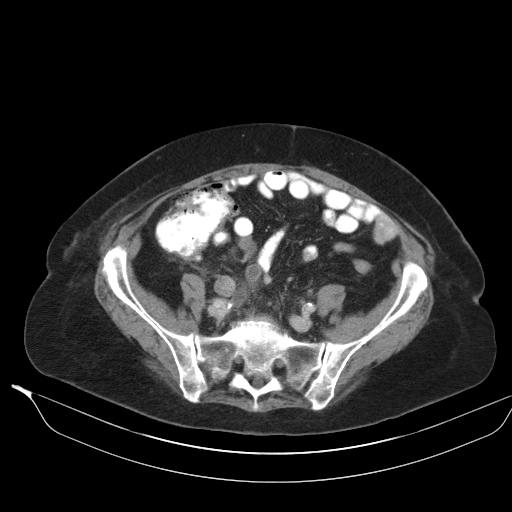
[im 48/119  soft-tissue]
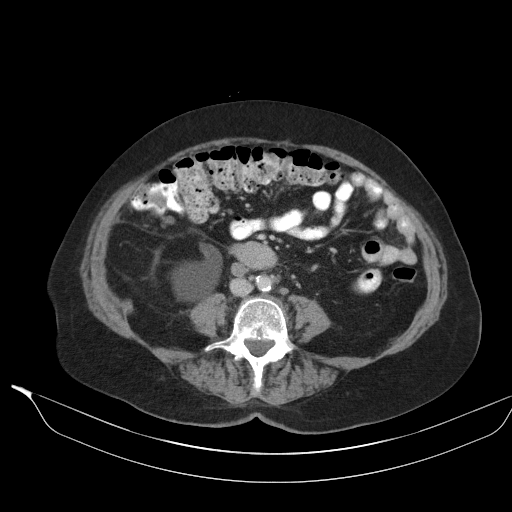
[im 71/119  soft-tissue]
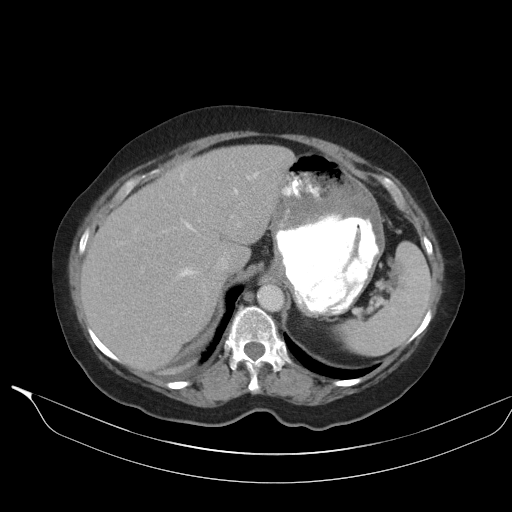
[im 83/119  soft-tissue]
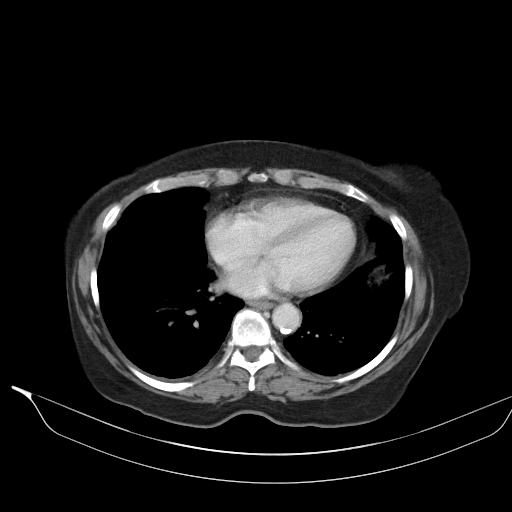
[im 95/119  soft-tissue]
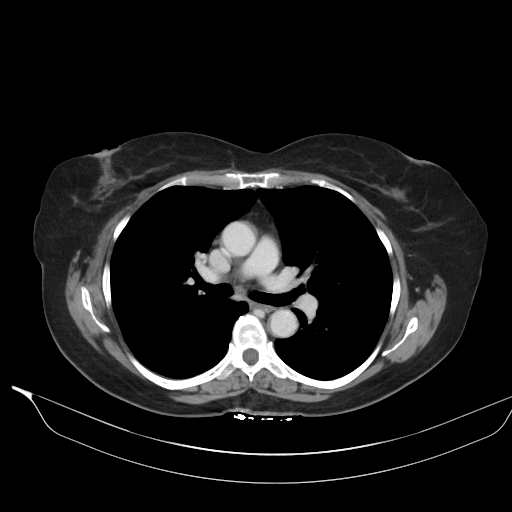
[im 107/119  soft-tissue]
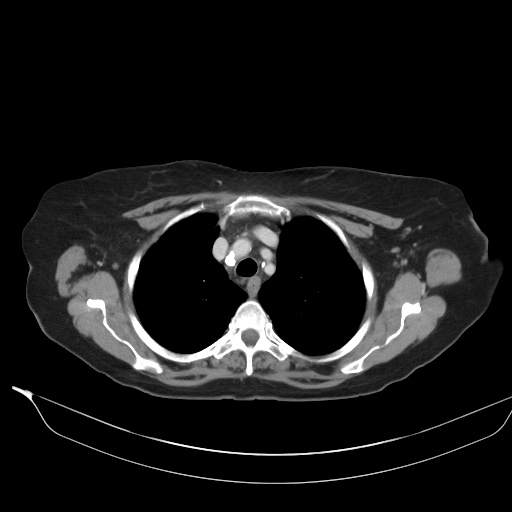

[Series 4: lung · axial · 0.80mm/px · z∈[-261,-153]mm · 4 of 141 slices shown]
[im 11/141  bone]
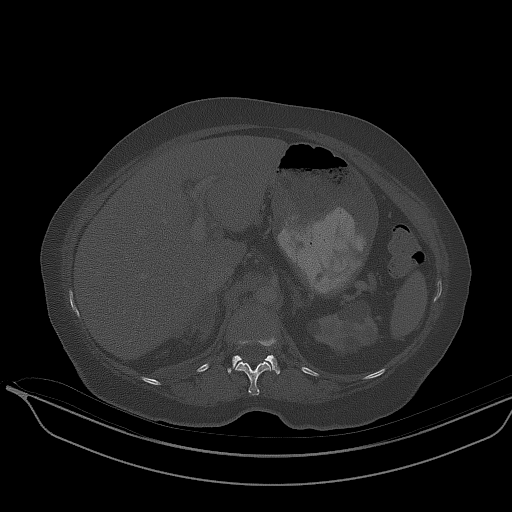
[im 33/141  bone]
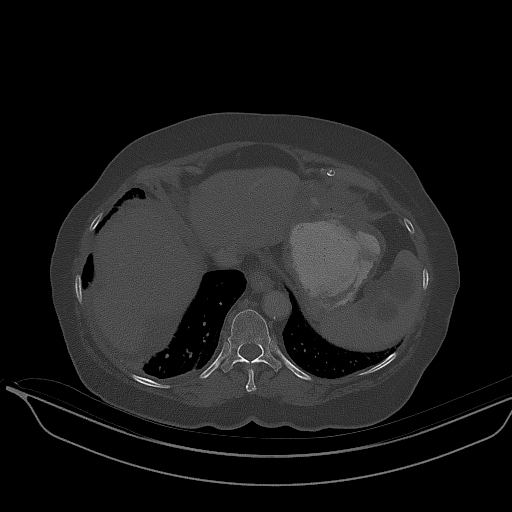
[im 44/141  bone]
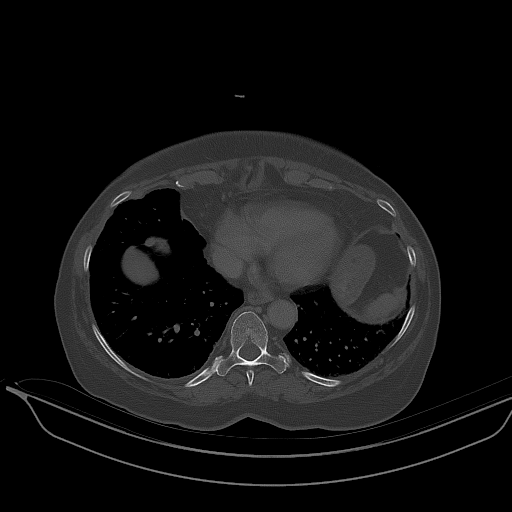
[im 65/141  bone]
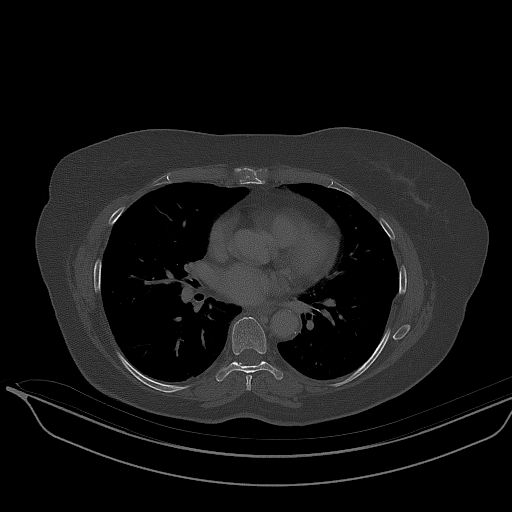

[Series 7: renal delay · axial · delayed · 0.81mm/px · z∈[-321,-261]mm · 2 of 37 slices shown, 5 images]
[im 13/37  soft-tissue]
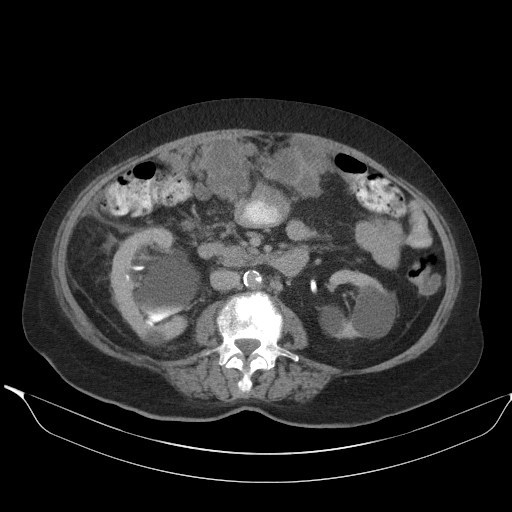
[im 13/37  lung]
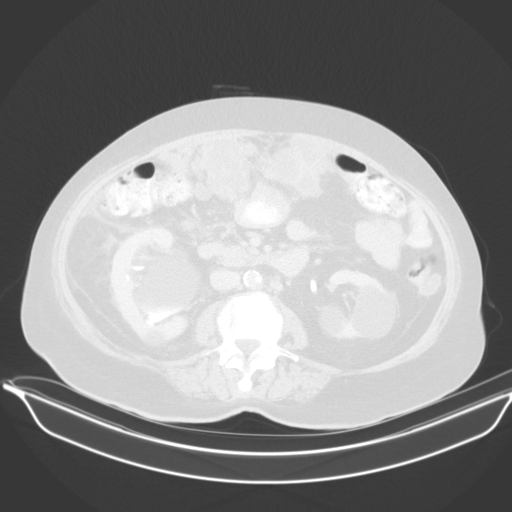
[im 13/37  bone]
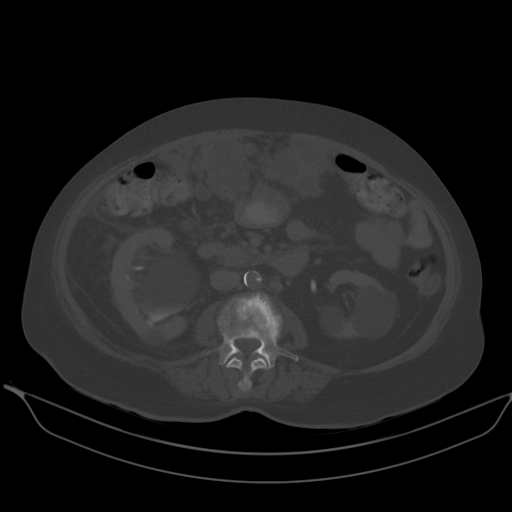
[im 25/37  soft-tissue]
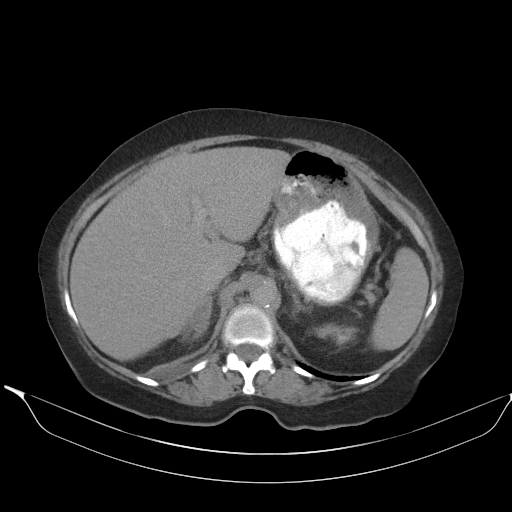
[im 25/37  lung]
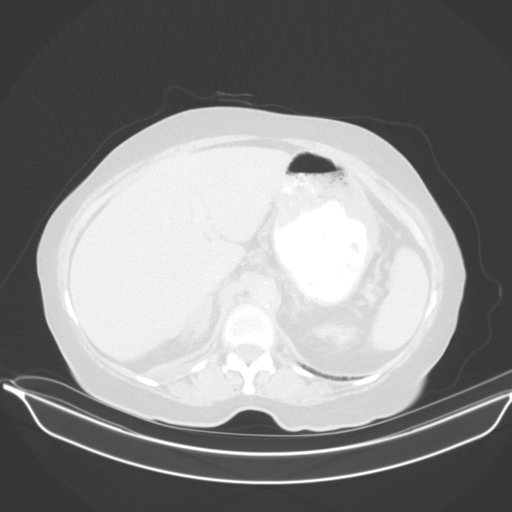

[14 of 32 positions shown; findings below may reference images not displayed]

FINDINGS: CT CHEST FINDINGS

Cardiovascular: Atherosclerotic calcification of the aorta and
coronary arteries. Heart is at the upper limits of normal in size to
mildly enlarged. No pericardial effusion.

Mediastinum/Nodes: Partially imaged left supraclavicular
low-attenuation mass measures 6.9 cm. No pathologically enlarged
mediastinal, hilar or axillary lymph nodes. Esophagus is grossly
unremarkable.

Lungs/Pleura: Centrilobular emphysema. There are nodular pleural
lesions in the mid and lower right hemithorax, with a trace amount
of right pleural fluid. Slight thickening of the right major
fissure. Left lung is clear. No pleural fluid. No left pleural
fluid. Airway is unremarkable.

Musculoskeletal: No worrisome lytic or sclerotic lesions.

CT ABDOMEN PELVIS FINDINGS

Hepatobiliary: A 2.9 cm heterogeneous low-attenuation lesion is seen
along the capsule of the right hepatic lobe dome, likely a
peritoneal implant. Liver and gallbladder are otherwise
unremarkable. No biliary ductal dilatation.

Pancreas: Negative.

Spleen: Heterogeneous lesions in the spleen measure up to 2.6 cm.

Adrenals/Urinary Tract: Right adrenal mass measures 3.1 cm. Left
adrenal gland is unremarkable. Low-attenuation lesions in the
kidneys measure up to 4.3 cm on the left and are likely cysts.
Hyperattenuating lesions in the upper pole left kidney measure up to
1.6 cm and can not be characterized as simple cysts. Severe right
hydronephrosis to the level of a heterogeneous right adnexal mass.
Left ureter is decompressed. Ureters enters somewhat anteriorly and
inferiorly along the bladder wall. Cystocele.

Stomach/Bowel: Stomach, small bowel, appendix and colon are
unremarkable.

Vascular/Lymphatic: Atherosclerotic calcification of the aorta
without aneurysm. Necrotic gastrohepatic ligament lymph node
measures 2.1 cm. Small bowel mesenteric lymph nodes measure up to
2.6 cm (series 2, image 87). Retroperitoneal external iliac lymph
nodes measure up to 12 mm on the right. Inguinal lymph nodes measure
up to 12 mm on the left.

Reproductive: Heterogeneous large mass in the right adnexa measures
5.4 x 6.7 cm. It is associated with the right ovary and/or
uterus/cervix. A necrotic mass in the posterior left adnexa measures
5.4 cm.

Other: Extensive omental caking and peritoneal nodularity. Index
omental mass measures 4.6 x 12.3 cm. No free fluid.

Musculoskeletal: Degenerative changes in the spine.
IMPRESSION: 1. Large heterogeneous right adnexal mass is contiguous with the
right ovary and uterus/cervix. Additional associated findings
include a separate left ovarian mass, peritoneal carcinomatosis,
necrotic adenopathy in the abdomen and pelvis, splenic metastases
and probable right pleural metastatic disease. Findings are
indicative of stage IV ovarian or uterine/cervical cancer.
2. Left supraclavicular necrotic mass, better imaged on 09/12/2018
and most indicative of metastatic disease.
3. Severe right hydronephrosis due to the right adnexal mass.
4. Trace right pleural effusion.
5. Hyperdense lesions in the upper pole left kidney can not be
characterized as cysts. If further evaluation is desired, MR abdomen
without and with contrast is recommended.
6. Cystocele.
7.  Aortic atherosclerosis (NMMYV-170.0).
8.  Emphysema ([id number].C).
